# Patient Record
Sex: Male | Born: 1985 | Race: White | Hispanic: No | Marital: Married | Smoking: Never smoker
Health system: Southern US, Community
[De-identification: ages and names within clinical notes are randomized; demographics above are authoritative.]

## PROBLEM LIST (undated history)

## (undated) DIAGNOSIS — F1910 Other psychoactive substance abuse, uncomplicated: Secondary | ICD-10-CM

## (undated) DIAGNOSIS — F32A Depression, unspecified: Secondary | ICD-10-CM

## (undated) DIAGNOSIS — F329 Major depressive disorder, single episode, unspecified: Secondary | ICD-10-CM

---

## 2005-08-11 ENCOUNTER — Emergency Department (HOSPITAL_COMMUNITY): Admission: EM | Admit: 2005-08-11 | Discharge: 2005-08-12 | Payer: Self-pay | Admitting: Emergency Medicine

## 2012-01-13 DIAGNOSIS — F1910 Other psychoactive substance abuse, uncomplicated: Secondary | ICD-10-CM | POA: Insufficient documentation

## 2012-01-14 ENCOUNTER — Encounter (HOSPITAL_COMMUNITY): Payer: Self-pay | Admitting: *Deleted

## 2012-01-14 ENCOUNTER — Emergency Department (HOSPITAL_COMMUNITY)
Admission: EM | Admit: 2012-01-14 | Discharge: 2012-01-15 | Disposition: A | Discharge status: Rehabilitation Facility | Payer: BC Managed Care – PPO | Attending: Emergency Medicine | Admitting: Emergency Medicine

## 2012-01-14 DIAGNOSIS — F1910 Other psychoactive substance abuse, uncomplicated: Secondary | ICD-10-CM

## 2012-01-14 HISTORY — DX: Depression, unspecified: F32.A

## 2012-01-14 HISTORY — DX: Other psychoactive substance abuse, uncomplicated: F19.10

## 2012-01-14 HISTORY — DX: Major depressive disorder, single episode, unspecified: F32.9

## 2012-01-14 LAB — RAPID URINE DRUG SCREEN, HOSP PERFORMED
Cocaine: NOT DETECTED
Opiates: POSITIVE — AB

## 2012-01-14 LAB — CBC WITH DIFFERENTIAL/PLATELET
Basophils Absolute: 0 10*3/uL (ref 0.0–0.1)
Basophils Relative: 1 % (ref 0–1)
Eosinophils Absolute: 0.2 10*3/uL (ref 0.0–0.7)
Lymphs Abs: 1.8 10*3/uL (ref 0.7–4.0)
MCH: 29.2 pg (ref 26.0–34.0)
MCHC: 35 g/dL (ref 30.0–36.0)
Neutrophils Relative %: 51 % (ref 43–77)
Platelets: 258 10*3/uL (ref 150–400)
RBC: 4.55 MIL/uL (ref 4.22–5.81)
RDW: 12.3 % (ref 11.5–15.5)

## 2012-01-14 LAB — BASIC METABOLIC PANEL
Calcium: 9.1 mg/dL (ref 8.4–10.5)
Creatinine, Ser: 0.76 mg/dL (ref 0.50–1.35)
GFR calc non Af Amer: >90 mL/min (ref >90)
Glucose, Bld: 88 mg/dL (ref 70–99)
Sodium: 137 mEq/L (ref 135–145)

## 2012-01-14 MED ORDER — DICYCLOMINE HCL 20 MG PO TABS: 20.0000 mg | ORAL_TABLET | Freq: Four times a day (QID) | ORAL | Status: DC | PRN

## 2012-01-14 MED ORDER — METHOCARBAMOL 500 MG PO TABS
500.0000 mg | ORAL_TABLET | Freq: Three times a day (TID) | ORAL | Status: DC | PRN
  Filled 2012-01-14: qty 1

## 2012-01-14 MED ORDER — ONDANSETRON 4 MG PO TBDP: 4.0000 mg | ORAL_TABLET | Freq: Four times a day (QID) | ORAL | Status: DC | PRN

## 2012-01-14 MED ORDER — LOPERAMIDE HCL 2 MG PO CAPS: 2.0000 mg | ORAL_CAPSULE | ORAL | Status: DC | PRN

## 2012-01-14 MED ORDER — HYDROXYZINE HCL 25 MG PO TABS
25.0000 mg | ORAL_TABLET | Freq: Four times a day (QID) | ORAL | Status: DC | PRN
  Administered 2012-01-14: 25 mg via ORAL
  Filled 2012-01-14: qty 1

## 2012-01-14 MED ORDER — IBUPROFEN 200 MG PO TABS
400.0000 mg | ORAL_TABLET | Freq: Once | ORAL | Status: AC
  Administered 2012-01-14: 400 mg via ORAL
  Filled 2012-01-14: qty 2

## 2012-01-14 NOTE — ED Notes (Signed)
Pt alert and oriented x4. Respirations even and unlabored, bilateral symmetrical rise and fall of chest. Skin warm and dry. In no acute distress. Denies needs.   

## 2012-01-14 NOTE — ED Provider Notes (Signed)
History     CSN: [id number]  Arrival date & time 01/13/12  2300   First MD Initiated Contact with Patient 01/14/12 0310      Chief Complaint  Patient presents with  . Medical Clearance    Narcotic addiction    (Consider location/radiation/quality/duration/timing/severity/associated sxs/prior treatment) HPI Hx per PT, requesting detox from benzos and opiates, has been to rehab before for the same, last use 24 hours ago, no withdrawal symptoms at this time. No SI. No HI. No hallucinations. No n/v/d. No f/c. No ABD pain or cramping. Has been using cocaine and marijuana as well. Currently employed. Past Medical History  Diagnosis Date  . Polysubstance abuse     History reviewed. No pertinent past surgical history.  History reviewed. No pertinent family history.  History  Substance Use Topics  . Smoking status: Never Smoker   . Smokeless tobacco: Not on file  . Alcohol Use: Yes     rarely      Review of Systems  Constitutional: Negative for fever and chills.  HENT: Negative for neck pain and neck stiffness.   Eyes: Negative for pain.  Respiratory: Negative for shortness of breath.   Cardiovascular: Negative for chest pain.  Gastrointestinal: Negative for abdominal pain.  Genitourinary: Negative for dysuria.  Musculoskeletal: Negative for back pain.  Skin: Negative for rash.  Neurological: Negative for headaches.  All other systems reviewed and are negative.    Allergies  Alka-seltzer and Childrens acetaminophen  Home Medications  No current outpatient prescriptions on file.  BP 119/73  Pulse 65  Temp 97.9 F (36.6 C) (Oral)  Resp 18  SpO2 100%  Physical Exam  Constitutional: He is oriented to person, place, and time. He appears well-developed and well-nourished.  HENT:  Head: Normocephalic and atraumatic.  Eyes: Conjunctivae and EOM are normal. Pupils are equal, round, and reactive to light.  Neck: Trachea normal. Neck supple. No thyromegaly present.    Cardiovascular: Normal rate, regular rhythm, S1 normal, S2 normal and normal pulses.     No systolic murmur is present   No diastolic murmur is present  Pulses:      Radial pulses are 2+ on the right side, and 2+ on the left side.  Pulmonary/Chest: Effort normal and breath sounds normal. He has no wheezes. He has no rhonchi. He has no rales. He exhibits no tenderness.  Abdominal: Soft. Normal appearance and bowel sounds are normal. There is no tenderness. There is no CVA tenderness and negative Murphy's sign.  Musculoskeletal:       BLE:s Calves nontender, no cords or erythema, negative Homans sign  Neurological: He is alert and oriented to person, place, and time. He has normal strength. No cranial nerve deficit or sensory deficit. GCS eye subscore is 4. GCS verbal subscore is 5. GCS motor subscore is 6.  Skin: Skin is warm and dry. No rash noted. He is not diaphoretic.  Psychiatric: His speech is normal.       Cooperative and appropriate    ED Course  Procedures (including critical care time)  CBC reviewed WNL.  Psych labs sent.  D/W ACT - will evaluate.   Psych holding orders initiated. Clonidine protocol initiated.  Medically cleared.  MDM  Nursing notes reviewed. VS reviewed. Labs reviewed.         Sunnie Nielsen, MD 01/14/12 [phone]

## 2012-01-14 NOTE — ED Notes (Signed)
Orders for med clearance placed on downtime forms.

## 2012-01-14 NOTE — ED Notes (Addendum)
Called Faith-Marie RN and she stated that the family member and baby could go back to the room 8, but they were only able to stay for . Family member belongings were locked in locker 4. Family member was wanded by security.

## 2012-01-14 NOTE — ED Notes (Signed)
Report given to nicole, rn

## 2012-01-14 NOTE — ED Notes (Signed)
Pt c/o narcotic addiction x 12 years. Pt has hx of polysubstance abuse. Pt states last use klonopin 1 mg x 5 tabs, Vicodin x 5 tabs, Xanax 1 mg x 3 tabs prior to arrival. Pt denies SI/HI/hallucinations and delusions.

## 2012-01-14 NOTE — ED Notes (Signed)
md alerted of pts pain. md will order medication

## 2012-01-14 NOTE — ED Notes (Signed)
rn talked to act team and conveyed to family and pt that if pt wants inpatient detox, he must be seen by act team and be referred. Pt verbalizes understanding this.

## 2012-01-15 ENCOUNTER — Encounter (HOSPITAL_COMMUNITY): Payer: Self-pay | Admitting: *Deleted

## 2012-01-15 NOTE — BH Assessment (Signed)
Assessment Note   Isaiah Arnold is a 26 y.o. male who presents to [facility] with request for detox, denies SI/HI/Psych.  Pt currently abusing Oxycontin 50-100mg s daily; Vicodin 25-50mg s daily; Adderall 20mg s daily; THC 2-3 grams daily; Klonopin 1-5mg s daily; Xanax 1-5mg s daily; Valium 1-5 mgs daily; Hydrocodine 10-15-5mg s daily.  Pt reports last use was 01/14/12.  Pt told this writer that he also occasionally uses Cocaine, approx 1 gram, last use was approx 2-3 wks ago.  Pt admits to using Suboxone dissolvable film papers every other week when he can't get drugs.  Pt says last used Suboxone 5 days ago and uses 2-8mg s when uses Suboxone.  Pt has been using since he was 27 yrs old and has never engaged in any detox program.  Pt c/o w/d sxs: shakes, anxiety, sweats, chills, body aches, stomach cramps, restless legs.  Pt told this Clinical research associate that he wants to enter rehab was told he had to have detox prior to entering in any rehab program.  Pt says he wants to detox now because his girlfriend has threatened to take there son and leave if he didn't seek help.    Axis I: Polysub Dep  Axis II: Deferred Axis III:  Past Medical History  Diagnosis Date  . Polysubstance abuse   . Depression    Axis IV: other psychosocial or environmental problems, problems related to social environment and problems with primary support group Axis V: 51-60 moderate symptoms  Past Medical History:  Past Medical History  Diagnosis Date  . Polysubstance abuse   . Depression     History reviewed. No pertinent past surgical history.  Family History: History reviewed. No pertinent family history.  Social History:  reports that he has never smoked. He does not have any smokeless tobacco history on file. He reports that he uses illicit drugs (Marijuana, Oxycodone, Opium, and Benzodiazepines). He reports that he does not drink alcohol.  Additional Social History:  Alcohol / Drug Use Pain Medications: Oxycontin, Vicodin, Hydrocodine    Prescriptions: None  Over the Counter: None  Longest period of sobriety (when/how long): None  Negative Consequences of Use: Personal relationships Withdrawal Symptoms: Sweats;Tremors;Fever / Chills;Agitation;Other (Comment) (W/D Sxs: anxiety, body aches/cramps, )  CIWA: CIWA-Ar BP: 100/63 mmHg Pulse Rate: 69  Nausea and Vomiting: no nausea and no vomiting Tactile Disturbances: none Tremor: no tremor Auditory Disturbances: not present Paroxysmal Sweats: no sweat visible Visual Disturbances: not present Anxiety: no anxiety, at ease Headache, Fullness in Head: none present Agitation: normal activity Orientation and Clouding of Sensorium: oriented and can do serial additions CIWA-Ar Total: 0  COWS: Clinical Opiate Withdrawal Scale (COWS) Resting Pulse Rate: Pulse Rate 80 or below Sweating: Subjective report of chills or flushing Restlessness: Reports difficulty sitting still, but is able to do so Pupil Size: Pupils pinned or normal size for room light Bone or Joint Aches: Patient reports sever diffuse aching of joints/muscles Runny Nose or Tearing: Not present GI Upset: No GI symptoms Tremor: Slight tremor observable Yawning: No yawning Anxiety or Irritability: Patient obviously irritable/anxious Gooseflesh Skin: Skin is smooth COWS Total Score: 8   Allergies:  Allergies  Allergen Reactions  . Alka-Seltzer (Aspirin Effervescent) Nausea And Vomiting  . Childrens Acetaminophen (Acetaminophen) Nausea And Vomiting    Home Medications:  (Not in a hospital admission)  OB/GYN Status:  No LMP for male patient.  General Assessment Data Location of Assessment: [facility] ED Living Arrangements: Alone Can pt return to current living arrangement?: Yes Admission Status: Voluntary Is patient  capable of signing voluntary admission?: Yes Transfer from: Acute Hospital Referral Source: MD  Education Status Is patient currently in school?: No Current Grade: None  Highest grade of school  patient has completed: None  Name of school: None  Contact person: None   Risk to self Suicidal Ideation: No Suicidal Intent: No Is patient at risk for suicide?: No Suicidal Plan?: No Access to Means: No What has been your use of drugs/alcohol within the last 12 months?: Abusing: Vicodin, Adderall, THC, Klonopin, Xanax, Valium, Hydrocodine  Previous Attempts/Gestures: No How many times?: 0  Other Self Harm Risks: None  Triggers for Past Attempts: None known Intentional Self Injurious Behavior: None Family Suicide History: Yes (Relative committed SI ) Recent stressful life event(s): Other (Comment);Conflict (Comment) (Chronic SA, Relational with girlfriend ) Persecutory voices/beliefs?: No Depression: Yes Depression Symptoms: Loss of interest in usual pleasures Substance abuse history and/or treatment for substance abuse?: Yes Suicide prevention information given to non-admitted patients: Not applicable  Risk to Others Homicidal Ideation: No Thoughts of Harm to Others: No Current Homicidal Intent: No Current Homicidal Plan: No Access to Homicidal Means: No Identified Victim: None  History of harm to others?: No Assessment of Violence: None Noted Violent Behavior Description: None Noted  Does patient have access to weapons?: No Criminal Charges Pending?: No Does patient have a court date: No  Psychosis Hallucinations: None noted Delusions: None noted  Mental Status Report Appear/Hygiene: Disheveled Eye Contact: Good Motor Activity: Unremarkable Speech: Logical/coherent Level of Consciousness: Alert Mood: Anxious;Irritable;Depressed;Ashamed/humiliated Affect: Anxious;Irritable;Depressed;Appropriate to circumstance Anxiety Level: Moderate Thought Processes: Coherent;Relevant Judgement: Unimpaired Orientation: Person;Place;Time;Situation Obsessive Compulsive Thoughts/Behaviors: None  Cognitive Functioning Concentration: Normal Memory: Recent Intact;Remote  Intact IQ: Average Insight: Fair Impulse Control: Fair Appetite: Good Weight Loss: 0  Weight Gain: 0  Sleep: No Change Total Hours of Sleep: 8  Vegetative Symptoms: None  ADLScreening [facility] Assessment Services) Patient's cognitive ability adequate to safely complete daily activities?: Yes Patient able to express need for assistance with ADLs?: Yes Independently performs ADLs?: Yes  Abuse/Neglect [facility]) Physical Abuse: Denies Verbal Abuse: Denies Sexual Abuse: Denies  Prior Inpatient Therapy Prior Inpatient Therapy: No Prior Therapy Dates: None  Prior Therapy Facilty/Provider(s): None  Reason for Treatment: None   Prior Outpatient Therapy Prior Outpatient Therapy: No Prior Therapy Dates: None  Prior Therapy Facilty/Provider(s): None  Reason for Treatment: None   ADL Screening (condition at time of admission) Patient's cognitive ability adequate to safely complete daily activities?: Yes Patient able to express need for assistance with ADLs?: Yes Independently performs ADLs?: Yes Weakness of Legs: None Weakness of Arms/Hands: None  Home Assistive Devices/Equipment Home Assistive Devices/Equipment: Eyeglasses  Therapy Consults (therapy consults require a physician order) PT Evaluation Needed: No OT Evalulation Needed: No SLP Evaluation Needed: No Abuse/Neglect Assessment (Assessment to be complete while patient is alone) Physical Abuse: Denies Verbal Abuse: Denies Sexual Abuse: Denies Exploitation of patient/patient's resources: Denies Self-Neglect: Denies Values / Beliefs Cultural Requests During Hospitalization: None Spiritual Requests During Hospitalization: None Consults Spiritual Care Consult Needed: No Social Work Consult Needed: No Merchant navy officer (For Healthcare) Advance Directive: Patient does not have advance directive;Patient would not like information Pre-existing out of facility DNR order (yellow form or pink MOST form): No Nutrition  Screen Diet: Regular Unintentional weight loss greater than 10lbs within the last month: No Problems chewing or swallowing foods and/or liquids: No Home Tube Feeding or Total Parenteral Nutrition (TPN): No Patient appears severely malnourished: No  Additional Information 1:1 In Past 12 Months?: No CIRT  Risk: No Elopement Risk: No Does patient have medical clearance?: Yes     Disposition:  Disposition Disposition of Patient: Inpatient treatment program;Referred to ([facility] ) Type of inpatient treatment program: Adult Patient referred to: [facility]  On Site Evaluation by:   Reviewed with Physician:     Murrell Redden 01/15/2012 1:21 AM

## 2012-01-15 NOTE — ED Notes (Signed)
[facility] driver has arrived to take pt to facility. MD notified of pt acceptance to [facility] and need for disposition and discharge.

## 2012-01-15 NOTE — ED Notes (Signed)
All belongings sent with patient to [facility]

## 2012-01-24 ENCOUNTER — Ambulatory Visit
Admission: RE | Admit: 2012-01-24 | Discharge: 2012-01-24 | Disposition: A | Discharge status: Home / Self Care | Payer: BC Managed Care – PPO | Source: Ambulatory Visit | Attending: Student | Admitting: Student

## 2012-01-24 ENCOUNTER — Other Ambulatory Visit: Payer: Self-pay | Admitting: Student

## 2012-01-24 DIAGNOSIS — R05 Cough: Secondary | ICD-10-CM

## 2013-12-11 IMAGING — CR DG CHEST 2V
3 series · 3 of 3 positions shown · non-contrast
Comparison: None.

CLINICAL DATA: Left-sided chest pain.  Nonsmoker.

CHEST - 2 VIEW

[view not recorded (1 of 3)]
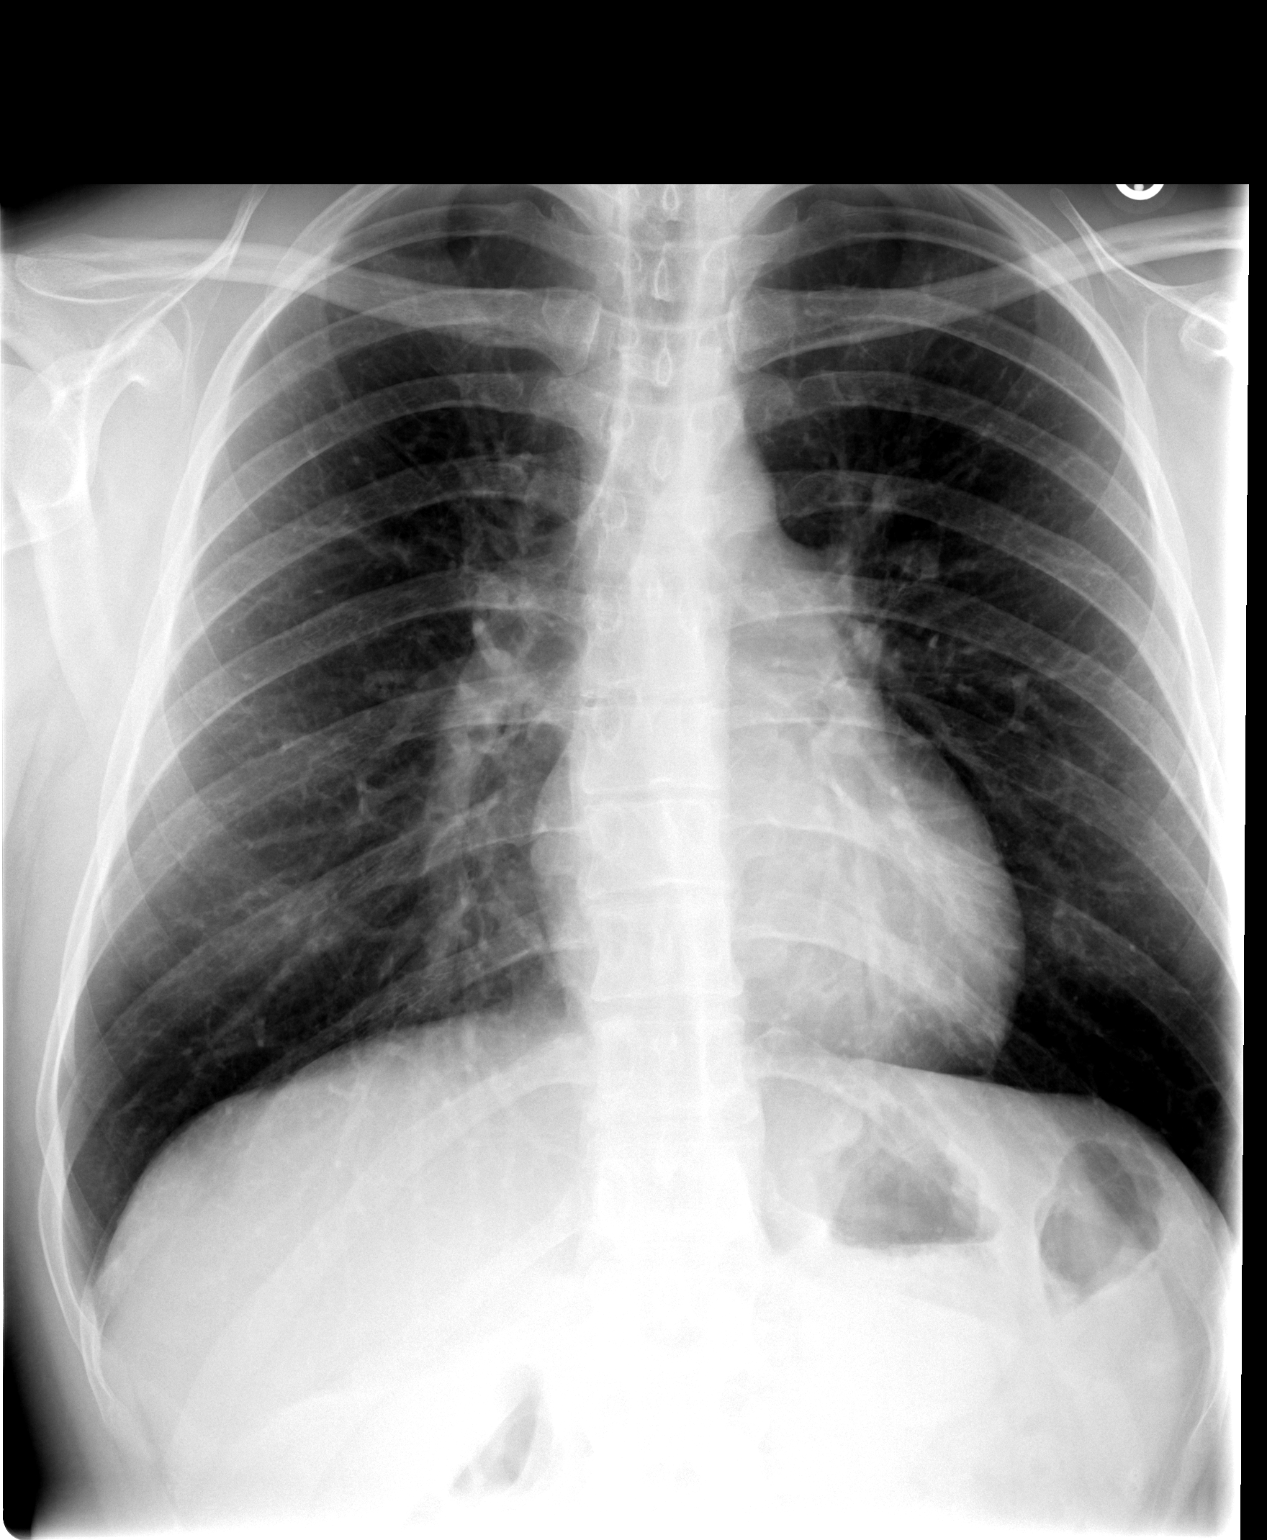

[view not recorded (2 of 3)]
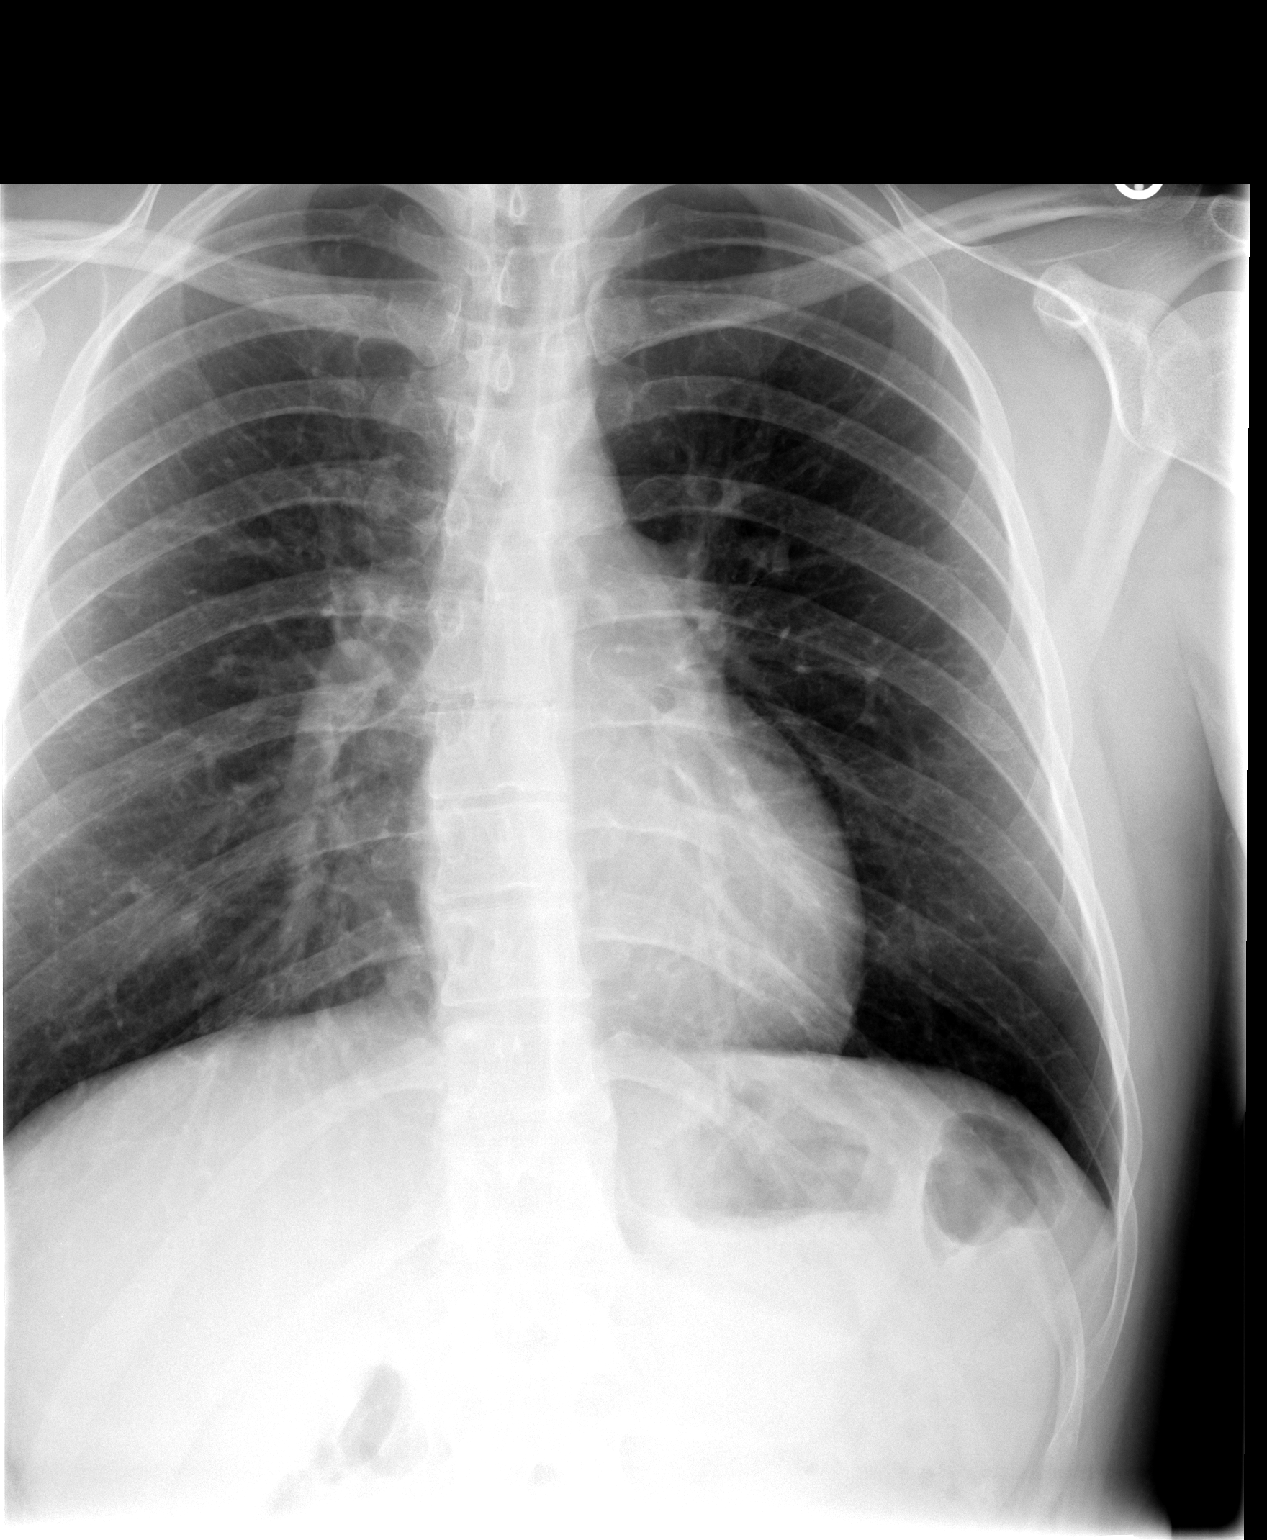

[view not recorded (3 of 3)]
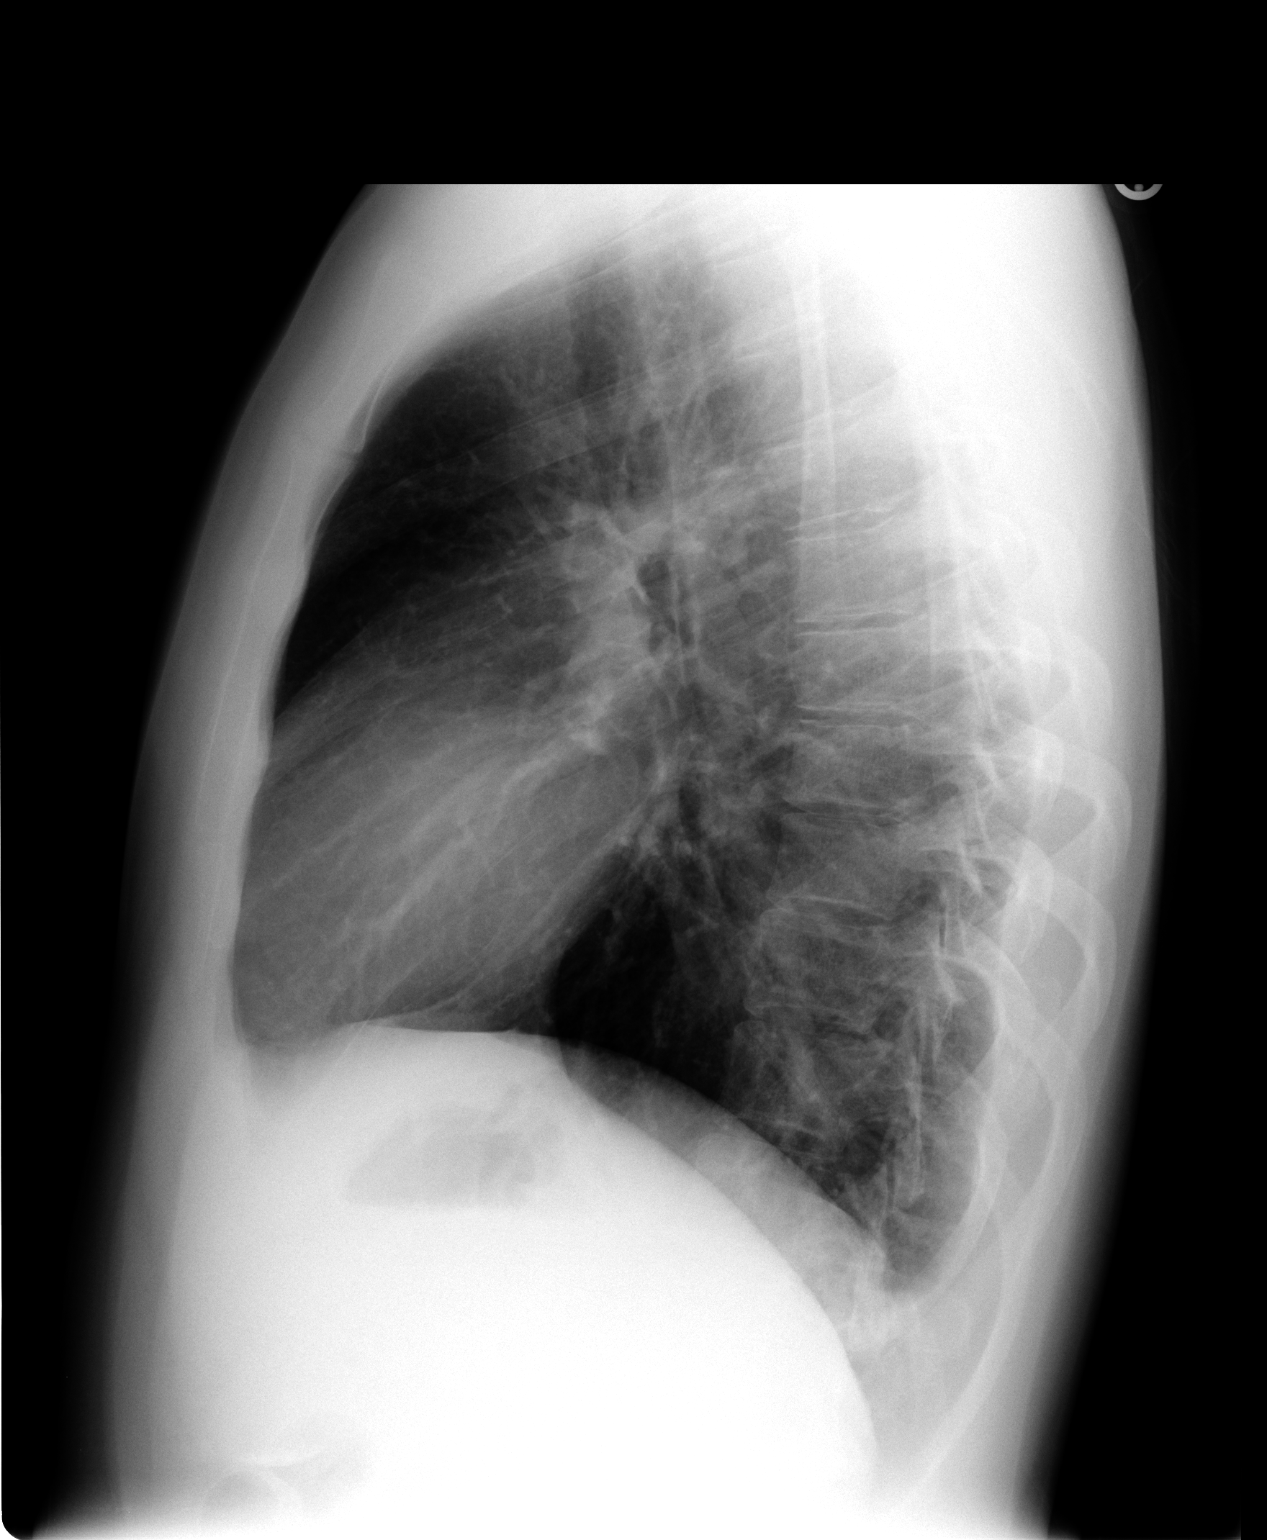

[3 of 3 positions shown; findings below may reference images not displayed]

FINDINGS: Midline trachea.  Normal heart size and mediastinal
contours. No pleural effusion or pneumothorax.  Clear lungs.
IMPRESSION: Normal chest.  No acute process or explanation for the left-sided
chest pain.

## 2019-04-01 ENCOUNTER — Other Ambulatory Visit: Payer: Self-pay

## 2019-04-01 DIAGNOSIS — Z20822 Contact with and (suspected) exposure to covid-19: Secondary | ICD-10-CM

## 2019-04-02 LAB — NOVEL CORONAVIRUS, NAA: SARS-CoV-2, NAA: NOT DETECTED

## 2019-06-17 ENCOUNTER — Ambulatory Visit: Payer: Managed Care, Other (non HMO) | Attending: Internal Medicine

## 2019-06-17 DIAGNOSIS — R238 Other skin changes: Secondary | ICD-10-CM

## 2019-06-17 DIAGNOSIS — U071 COVID-19: Secondary | ICD-10-CM

## 2019-06-18 LAB — NOVEL CORONAVIRUS, NAA: SARS-CoV-2, NAA: NOT DETECTED

## 2019-08-28 ENCOUNTER — Ambulatory Visit: Payer: Managed Care, Other (non HMO) | Attending: Internal Medicine

## 2019-08-28 DIAGNOSIS — Z20822 Contact with and (suspected) exposure to covid-19: Secondary | ICD-10-CM

## 2019-08-29 LAB — NOVEL CORONAVIRUS, NAA: SARS-CoV-2, NAA: NOT DETECTED

## 2020-02-24 NOTE — Progress Notes (Signed)
CARDIOLOGY CONSULT NOTE       Patient ID: Isaiah Arnold MRN: [id number] DOB/AGE: 1985/08/06 34 y.o.  Admit date: (Not on file) Referring Physician: Rosann Auerbach PT/OT  Primary Physician: No primary care provider on file. Primary Cardiologist: New Reason for Consultation: Chest Pain / Abnormal ECG   Active Problems:   * No active hospital problems. *   HPI:  34 y.o. history of depression and polysubstance abuse Has used oxycontin, Vicodin, Adderall, THC, Klonopin, Xanax, Valium and Hydrocodine in past.  Has had inpatient admissions to [facility]. Doing better Does smoke marijuana daily and uses Adderall to stay awake. 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 2 weeks ago had atypical left sided chest pain lasting hours worse at rest and night when trying to sleep. Indicated working out harder some but didn't feel like muscle pull. No fevers , pleurisy LE edema, calf pain. He has no old ECG ECG in clinic today with RBBB.   ROS All other systems reviewed and negative except as noted above  Past Medical History:  Diagnosis Date  . Depression   . Polysubstance abuse     No family history on file.  Social History   Socioeconomic History  . Marital status: Single    Spouse name: Not on file  . Number of children: Not on file  . Years of education: Not on file  . Highest education level: Not on file  Occupational History  . Not on file  Tobacco Use  . Smoking status: Never Smoker  Substance and Sexual Activity  . Alcohol use: No    Comment: rarely  . Drug use: Yes    Types: Marijuana, Oxycodone, Opium, Benzodiazepines    Comment: narcotics, benzos  . Sexual activity: Yes    Birth control/protection: None  Other Topics Concern  . Not on file  Social History Narrative  . Not on file   Social Determinants of Health   Financial Resource Strain:   . Difficulty of Paying Living Expenses: Not on file  Food Insecurity:   . Worried About Programme researcher, broadcasting/film/video in  the Last Year: Not on file  . Ran Out of Food in the Last Year: Not on file  Transportation Needs:   . Lack of Transportation (Medical): Not on file  . Lack of Transportation (Non-Medical): Not on file  Physical Activity:   . Days of Exercise per Week: Not on file  . Minutes of Exercise per Session: Not on file  Stress:   . Feeling of Stress : Not on file  Social Connections:   . Frequency of Communication with Friends and Family: Not on file  . Frequency of Social Gatherings with Friends and Family: Not on file  . Attends Religious Services: Not on file  . Active Member of Clubs or Organizations: Not on file  . Attends Banker Meetings: Not on file  . Marital Status: Not on file  Intimate Partner Violence:   . Fear of Current or Ex-Partner: Not on file  . Emotionally Abused: Not on file  . Physically Abused: Not on file  . Sexually Abused: Not on file    No past surgical history on file.   No current outpatient medications on file.    Physical Exam: There were no vitals taken for this visit.    Affect appropriate Healthy:  appears stated age HEENT: normal Neck supple with no adenopathy JVP normal no bruits no thyromegaly Lungs clear  with no wheezing and good diaphragmatic motion Heart:  S1/S2 no murmur, no rub, gallop or click PMI normal Abdomen: benighn, BS positve, no tenderness, no AAA no bruit.  No HSM or HJR Distal pulses intact with no bruits No edema Neuro non-focal Skin warm and dry No muscular weakness  Labs:   Lab Results  Component Value Date   WBC 5.2 01/13/2012   HGB 13.3 01/13/2012   HCT 38.0 (L) 01/13/2012   MCV 83.5 01/13/2012   PLT 258 01/13/2012   No results for input(s): NA, K, CL, CO2, BUN, CREATININE, CALCIUM, PROT, BILITOT, ALKPHOS, ALT, AST, GLUCOSE in the last 168 hours.  Invalid input(s): LABALBU No results found for: CKTOTAL, CKMB, CKMBINDEX, TROPONINI No results found for: CHOL No results found for: HDL No results  found for: LDLCALC No results found for: TRIG No results found for: CHOLHDL No results found for: LDLDIRECT    Radiology: No results found.  EKG: SR RBBB rate 74    ASSESSMENT AND PLAN:   1. Chest Pain: atypical with abnormal ECG RBBB f/u exercise echo to r/o structural heart disease and assess hemodynamic response to exercise 2. Polysubstance Abuse. Discussed strategies to avoid relapse, Discussed his adderall use in particular regarding stimulants  3. RBBB:  No old ecg may be normal variant for him see above regarding echo Yearly ECG   F/U PRN if stress echo normal   Signed: Charlton Haws 02/24/2020, 5:41 PM

## 2020-03-02 ENCOUNTER — Other Ambulatory Visit: Payer: Self-pay

## 2020-03-02 ENCOUNTER — Encounter: Payer: Self-pay | Admitting: Cardiovascular Disease

## 2020-03-02 ENCOUNTER — Ambulatory Visit: Payer: Managed Care, Other (non HMO) | Admitting: Cardiovascular Disease

## 2020-03-02 VITALS — BP 124/82 | HR 74 | Ht 69.0 in | Wt 163.6 lb

## 2020-03-02 DIAGNOSIS — R9431 Abnormal electrocardiogram [ECG] [EKG]: Secondary | ICD-10-CM

## 2020-03-02 DIAGNOSIS — R079 Chest pain, unspecified: Secondary | ICD-10-CM

## 2020-03-02 NOTE — Patient Instructions (Addendum)
Medication Instructions:  *If you need a refill on your cardiac medications before your next appointment, please call your pharmacy*  Lab Work: If you have labs (blood work) drawn today and your tests are completely normal, you will receive your results only by: Marland Kitchen MyChart Message (if you have MyChart) OR . A paper copy in the mail If you have any lab test that is abnormal or we need to change your treatment, we will call you to review the results.  Testing/Procedures: Your physician has requested that you have a stress echocardiogram. For further information please visit [email or web address] Please follow instruction sheet as given.  Follow-Up: At [facility], you and your health needs are our priority.  As part of our continuing mission to provide you with exceptional heart care, we have created designated Provider Care Teams.  These Care Teams include your primary Cardiologist (physician) and Advanced Practice Providers (APPs -  Physician Assistants and Nurse Practitioners) who all work together to provide you with the care you need, when you need it.  We recommend signing up for the patient portal called "MyChart".  Sign up information is provided on this After Visit Summary.  MyChart is used to connect with patients for Virtual Visits (Telemedicine).  Patients are able to view lab/test results, encounter notes, upcoming appointments, etc.  Non-urgent messages can be sent to your provider as well.   To learn more about what you can do with MyChart, go to [email or web address].    Your next appointment:   As needed  The format for your next appointment:   In Person  Provider:   You may see Dr. Eden Emms or one of the following Advanced Practice Providers on your designated Care Team:    Norma Fredrickson, NP  Nada Boozer, NP  Georgie Chard, NP

## 2020-03-14 ENCOUNTER — Other Ambulatory Visit (HOSPITAL_COMMUNITY)
Admission: RE | Admit: 2020-03-14 | Discharge: 2020-03-14 | Disposition: A | Discharge status: Home / Self Care | Payer: Managed Care, Other (non HMO) | Source: Ambulatory Visit | Attending: Cardiovascular Disease | Admitting: Cardiovascular Disease

## 2020-03-14 ENCOUNTER — Telehealth (HOSPITAL_COMMUNITY): Payer: Self-pay | Admitting: Cardiovascular Disease

## 2020-03-14 ENCOUNTER — Telehealth: Payer: Self-pay | Admitting: Cardiovascular Disease

## 2020-03-14 DIAGNOSIS — Z01812 Encounter for preprocedural laboratory examination: Secondary | ICD-10-CM | POA: Insufficient documentation

## 2020-03-14 DIAGNOSIS — Z20822 Contact with and (suspected) exposure to covid-19: Secondary | ICD-10-CM | POA: Diagnosis not present

## 2020-03-14 LAB — SARS CORONAVIRUS 2 (TAT 6-24 HRS): SARS Coronavirus 2: NEGATIVE

## 2020-03-14 NOTE — Telephone Encounter (Signed)
Patient states he was not told he would have to quarantine for 3 days after his covid test and he has work. He would like to know if the quarantine is required for the stress echo.

## 2020-03-14 NOTE — Telephone Encounter (Signed)
Patient called and cancelled Stress echo due to he [place] unable to quarantine and was not told he had to when he scheduled appointment per scheduler. Appt was made by scheduler at check out.. Order will be removed and if patient calls back to schedule we will reinstate the order.

## 2020-03-14 NOTE — Telephone Encounter (Signed)
Returned call to pt and made him aware that yes he is required to quarantine after his covid test was complete until his Stress Echo is done. Pt was very upset over the phone because he states that no one told him that at the appointment and this is important information for him to know. Pt requested to cancel his testing. Appt has been cancelled and pt declined to reschedule  Will forward to Jaynee Eagles, Rn to make her aware.

## 2020-03-17 ENCOUNTER — Other Ambulatory Visit (HOSPITAL_COMMUNITY): Payer: Managed Care, Other (non HMO)

## 2021-02-20 ENCOUNTER — Ambulatory Visit (INDEPENDENT_AMBULATORY_CARE_PROVIDER_SITE_OTHER): Payer: Managed Care, Other (non HMO) | Admitting: Otolaryngology

## 2021-02-20 ENCOUNTER — Other Ambulatory Visit: Payer: Self-pay

## 2021-02-20 DIAGNOSIS — H6983 Other specified disorders of Eustachian tube, bilateral: Secondary | ICD-10-CM | POA: Diagnosis not present

## 2021-02-20 DIAGNOSIS — H6506 Acute serous otitis media, recurrent, bilateral: Secondary | ICD-10-CM | POA: Diagnosis not present

## 2021-02-20 MED ORDER — TRIAMCINOLONE ACETONIDE 55 MCG/ACT NA AERO: 2.0000 | INHALATION_SPRAY | Freq: Every day | NASAL | 12 refills | Status: AC

## 2021-02-20 NOTE — Progress Notes (Signed)
HPI: Isaiah Arnold is a 35 y.o. male who returns today for evaluation of ear complaints.  He had previously been seen a little over 3 years ago with similar complaints.  He had done well up until about 2 or 3 weeks ago when he had a lot of congestion in his nose and sinus problems.  He subsequently developed a left earache with pressure and decreased hearing in both ears.  He was treated with antibiotics as well as Afrin but still has congestion and fullness in his ears and has difficulty "popping" his ears.  He also complains of decreased hearing..  Past Medical History:  Diagnosis Date   Depression    Polysubstance abuse (HCC)    No past surgical history on file. Social History   Socioeconomic History   Marital status: Single    Spouse name: Not on file   Number of children: Not on file   Years of education: Not on file   Highest education level: Not on file  Occupational History   Not on file  Tobacco Use   Smoking status: Never   Smokeless tobacco: Never  Substance and Sexual Activity   Alcohol use: No    Comment: rarely   Drug use: Yes    Types: Marijuana, Oxycodone, Opium, Benzodiazepines    Comment: narcotics, benzos   Sexual activity: Yes    Birth control/protection: None  Other Topics Concern   Not on file  Social History Narrative   Not on file   Social Determinants of Health   Financial Resource Strain: Not on file  Food Insecurity: Not on file  Transportation Needs: Not on file  Physical Activity: Not on file  Stress: Not on file  Social Connections: Not on file   Family History  Problem Relation Age of Onset   Heart disease Father    Allergies  Allergen Reactions   Alka-Seltzer [Aspirin Effervescent] Nausea And Vomiting   Childrens Acetaminophen [Acetaminophen] Nausea And Vomiting   Prior to Admission medications   Not on File     Positive ROS: Otherwise negative  All other systems have been reviewed and were otherwise negative with the  exception of those mentioned in the HPI and as above.  Physical Exam: Constitutional: Alert, well-appearing, no acute distress Ears: External ears without lesions or tenderness. Ear canals are clear bilaterally.  Both TMs are retracted with middle ear effusion that is minimal and serous.  I was able to insufflate some air behind both TMs in the office.  On hearing screening with a 512 1024 tuning fork Weber was midline.  AC was equivocal to [facility] on the right side and AC was greater than BC on the left side. Nasal: External nose without lesions. Septum with mild deformity and moderate rhinitis.  After decongesting the nose both millimeters regions were clear with no evidence of active infection or mucopurulent discharge.. Clear nasal passages Oral: Lips and gums without lesions. Tongue and palate mucosa without lesions. Posterior oropharynx clear. Neck: No palpable adenopathy or masses Respiratory: Breathing comfortably  Skin: No facial/neck lesions or rash noted.  Procedures  Assessment: Chronic rhinitis with history of chronic eustachian tube dysfunction.  Plan: Recommended regular use of nasal steroid spray and will call in Nasacort 2 sprays each nostril daily at night into his pharmacy. Also gave him information on 2 devices at may help him "pop his ears". [place]achi in the ear popper. He will follow-up as needed.   Narda Bonds, MD

## 2022-01-31 ENCOUNTER — Telehealth (HOSPITAL_COMMUNITY): Payer: Self-pay | Admitting: Licensed Clinical Social Worker

## 2022-01-31 NOTE — Telephone Encounter (Signed)
The therapist receives a voicemail from [place] saying that he is leaving [facility] early and wants to possibly attend this SA IOP.  The therapist gives him a brief overview of the program and [place] schedules to come for a CCA with this therapist on 02/05/22 at 1 p.m.  Myrna Blazer, MA, LCSW, [facility], LCAS 01/31/2022

## 2022-02-01 NOTE — Telephone Encounter (Signed)
Sair left a voicemail for this therapist to call him back today.  Myrna Blazer, MA, LCSW, [facility], LCAS 01/31/2022

## 2022-02-05 ENCOUNTER — Encounter (HOSPITAL_COMMUNITY): Payer: Self-pay

## 2022-02-05 ENCOUNTER — Ambulatory Visit (INDEPENDENT_AMBULATORY_CARE_PROVIDER_SITE_OTHER): Payer: 59 | Admitting: Licensed Clinical Social Worker

## 2022-02-05 DIAGNOSIS — F1420 Cocaine dependence, uncomplicated: Secondary | ICD-10-CM

## 2022-02-05 DIAGNOSIS — F1220 Cannabis dependence, uncomplicated: Secondary | ICD-10-CM

## 2022-02-05 DIAGNOSIS — F1590 Other stimulant use, unspecified, uncomplicated: Secondary | ICD-10-CM

## 2022-02-05 DIAGNOSIS — F1120 Opioid dependence, uncomplicated: Secondary | ICD-10-CM

## 2022-02-05 DIAGNOSIS — F1320 Sedative, hypnotic or anxiolytic dependence, uncomplicated: Secondary | ICD-10-CM
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
## 2022-02-05 NOTE — Progress Notes (Signed)
See CCA 

## 2022-02-05 NOTE — Plan of Care (Signed)
Isaiah Arnold verbally agrees to the following Care Plan: Problem:  Substance Use Goal:  Isaiah Arnold will abstain from using drugs 7/7 days per week based on self-report and weekly UDS. Outcome: Not Applicable Goal: Isaiah Arnold will report a reduction in his feelings of depression as evidenced by his PHQ-9 dropping from an 18 to a 4 or less.  Outcome: Not Applicable

## 2022-02-07 ENCOUNTER — Other Ambulatory Visit (HOSPITAL_COMMUNITY): Payer: 59 | Attending: Medical | Admitting: Licensed Clinical Social Worker

## 2022-02-07 VITALS — BP 130/82 | HR 76 | Ht 69.0 in | Wt 155.0 lb

## 2022-02-07 DIAGNOSIS — F1420 Cocaine dependence, uncomplicated: Secondary | ICD-10-CM | POA: Diagnosis not present

## 2022-02-07 DIAGNOSIS — F1320 Sedative, hypnotic or anxiolytic dependence, uncomplicated: Secondary | ICD-10-CM | POA: Insufficient documentation

## 2022-02-07 DIAGNOSIS — F1011 Alcohol abuse, in remission: Secondary | ICD-10-CM | POA: Diagnosis not present

## 2022-02-07 DIAGNOSIS — F1924 Other psychoactive substance dependence with psychoactive substance-induced mood disorder: Secondary | ICD-10-CM | POA: Diagnosis not present

## 2022-02-07 DIAGNOSIS — Z6372 Alcoholism and drug addiction in family: Secondary | ICD-10-CM

## 2022-02-07 DIAGNOSIS — F4312 Post-traumatic stress disorder, chronic: Secondary | ICD-10-CM | POA: Diagnosis present

## 2022-02-07 DIAGNOSIS — F1590 Other stimulant use, unspecified, uncomplicated: Secondary | ICD-10-CM | POA: Diagnosis not present

## 2022-02-07 DIAGNOSIS — Z79899 Other long term (current) drug therapy: Secondary | ICD-10-CM | POA: Insufficient documentation

## 2022-02-07 DIAGNOSIS — F1220 Cannabis dependence, uncomplicated: Secondary | ICD-10-CM | POA: Insufficient documentation

## 2022-02-07 DIAGNOSIS — F1120 Opioid dependence, uncomplicated: Secondary | ICD-10-CM | POA: Insufficient documentation

## 2022-02-07 DIAGNOSIS — F439 Reaction to severe stress, unspecified: Secondary | ICD-10-CM | POA: Diagnosis not present

## 2022-02-07 DIAGNOSIS — F1920 Other psychoactive substance dependence, uncomplicated: Secondary | ICD-10-CM

## 2022-02-07 DIAGNOSIS — T7432XS Child psychological abuse, confirmed, sequela: Secondary | ICD-10-CM

## 2022-02-07 MED ORDER — BACLOFEN 10 MG PO TABS: 10.0000 mg | ORAL_TABLET | Freq: Three times a day (TID) | ORAL | 1 refills | Status: DC
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
## 2022-02-07 NOTE — Progress Notes (Signed)
Daily Group Progress Note   Program: CD IOP     Group Time: 9 a.m. to 12 p.m.   Type of Therapy: Process and Psychoeducational    Topic: The therapist checks in with group members, assesses for SI/HI/psychosis and overall level of functioning. The therapist inquires about sobriety date and number of community support meetings attended since last session.    The therapist has group members do introductions as there is a new member in group and another fairly new group member who has not sat through group introductions before in which members explain how they came to be in [facility].   The therapist educates group members on the dangers associating with withdrawing from alcohol and benzodiazepines. He talks about tobacco as being the gateway drug and encourages members who smoke to consider smoking cessation sharing data on how quitting smoking increases the long-term success of quitting other substances.   The therapist facilitates a discussion on the reasons that people continue to relapse in spite of past consequences talking about the role of Dopamine; the fact that people can develop amnesia for past consequences once they are far enough in the past; issues that people with addiction have with trusting people; how substance use is the only way some people know how to connect socially; and people's rebalancing the equation considering the euphoria that they get from getting drunk or high worth the negative outcomes.    Summary: Winner presents for his initial group rating his depression as a "0" and anxiety as a "2" with no SI or HI.   He says that he has attended some meetings since he met with this therapist.  Kalven talks about the fact that he is struggling with feeling foggy and that he is incapable of enjoying anything. He admits that his disease continues to tell him that once he gets the drugs out of his system that he can perhaps just smoke pot. When the therapist asks what his health self  is saying, Trek says that he knows based on past experience that any limited drug use will lead back to full-blown drug use.   He cannot conceive of being able to enjoy life without drugs and says that he started using at around 13 so has no idea concerning what other hobbies or activities that he may enjoy.  Sava is encouraged to not think of never using again but to just focus on a day at a time and is told that his withdrawal symptoms with improve with time with the therapist reminding him that he left treatment before being recommended to do so.    Progress Towards Goals: Ahaan denies any drug use.    UDS collected: Yes Results: No   AA/NA attended?: Yes   Sponsor?: No   Trampus Phenix, [place], LCSW, [facility], [place] 02/07/2022

## 2022-02-09 ENCOUNTER — Other Ambulatory Visit (HOSPITAL_COMMUNITY): Payer: 59 | Attending: Medical | Admitting: Licensed Clinical Social Worker

## 2022-02-09 DIAGNOSIS — R45851 Suicidal ideations: Secondary | ICD-10-CM | POA: Diagnosis present

## 2022-02-09 DIAGNOSIS — F1420 Cocaine dependence, uncomplicated: Secondary | ICD-10-CM

## 2022-02-09 DIAGNOSIS — F1320 Sedative, hypnotic or anxiolytic dependence, uncomplicated: Secondary | ICD-10-CM

## 2022-02-09 DIAGNOSIS — F1220 Cannabis dependence, uncomplicated: Secondary | ICD-10-CM

## 2022-02-09 DIAGNOSIS — F1590 Other stimulant use, unspecified, uncomplicated: Secondary | ICD-10-CM

## 2022-02-09 NOTE — Progress Notes (Signed)
Daily Group Progress Note   Program: CD IOP     Group Time: 9 a.m. to 12 p.m.   Type of Therapy: Process and Psychoeducational    Topic: The therapist checks in with group members, assesses for SI/HI/psychosis and overall level of functioning. The therapist inquires about sobriety date and number of community support meetings attended since last session.    The therapist addresses group members' hesitancy to want to attend meetings talking about the AA saying, "fake it until you make it." He also facilitate a discussion concerning people feeling like they do not fit in AA or NA as the other members are either considered worse off than they are or the opposite. The therapist uses the Jellenick Curve to illustrate that people with addiction have the same disease but can be in different states of progression that other people are.  The therapist shows the video, "Breaking The Addiction Cycle" and solicits group members' reaction to the information presented in this video while confusing on the need to challenge and change one's beliefs and to avoid engaging in using rituals.    Summary: Isaiah Arnold presents for his group rating his depression as a "0" and anxiety as a "0" with no SI or HI.   He says that he has attended about twelve meetings total but has not shared and says that he feels almost guilty about attending when he hears stories people share in which they have experienced consequences such as imprisonment.   He also continues to struggle with accepting that he is an addict and says that his tendency has always been to solve things on his own so he keeps thinking that he Isaiah Arnold be able to do the same with his addiction.  Isaiah Arnold notes that the video helps him to see that his beliefs about what an addict looks like need to change. He says that the discussion on rituals is so spot on that it caused him to start having his first strong cravings to use in weeks.   He is encouraged to keep attending  meetings and reminded that the physical symptoms and cravings he is experiencing will get better or time with Isaiah Arnold noting that he is a little better today than he was in last group.     Progress Towards Goals: Isaiah Arnold denies any drug use.    UDS collected: No Results: No   AA/NA attended?: Yes   Sponsor?: No   Myrna Blazer, MA, LCSW, [facility], LCAS 02/09/2022

## 2022-02-12 ENCOUNTER — Other Ambulatory Visit (HOSPITAL_COMMUNITY): Payer: 59 | Attending: Medical | Admitting: Licensed Clinical Social Worker

## 2022-02-12 ENCOUNTER — Encounter (HOSPITAL_COMMUNITY): Payer: Self-pay | Admitting: Licensed Clinical Social Worker

## 2022-02-12 DIAGNOSIS — Z6372 Alcoholism and drug addiction in family: Secondary | ICD-10-CM | POA: Diagnosis not present

## 2022-02-12 DIAGNOSIS — F1420 Cocaine dependence, uncomplicated: Secondary | ICD-10-CM | POA: Insufficient documentation

## 2022-02-12 DIAGNOSIS — Z811 Family history of alcohol abuse and dependence: Secondary | ICD-10-CM | POA: Diagnosis not present

## 2022-02-12 DIAGNOSIS — F1590 Other stimulant use, unspecified, uncomplicated: Secondary | ICD-10-CM | POA: Diagnosis not present

## 2022-02-12 DIAGNOSIS — F1320 Sedative, hypnotic or anxiolytic dependence, uncomplicated: Secondary | ICD-10-CM | POA: Insufficient documentation

## 2022-02-12 DIAGNOSIS — F1120 Opioid dependence, uncomplicated: Secondary | ICD-10-CM | POA: Insufficient documentation

## 2022-02-12 DIAGNOSIS — F4312 Post-traumatic stress disorder, chronic: Secondary | ICD-10-CM | POA: Diagnosis not present

## 2022-02-12 DIAGNOSIS — F1920 Other psychoactive substance dependence, uncomplicated: Secondary | ICD-10-CM | POA: Insufficient documentation

## 2022-02-12 DIAGNOSIS — Z62811 Personal history of psychological abuse in childhood: Secondary | ICD-10-CM | POA: Diagnosis not present

## 2022-02-12 DIAGNOSIS — Z818 Family history of other mental and behavioral disorders: Secondary | ICD-10-CM | POA: Insufficient documentation

## 2022-02-12 DIAGNOSIS — F1220 Cannabis dependence, uncomplicated: Secondary | ICD-10-CM | POA: Diagnosis not present

## 2022-02-12 NOTE — Progress Notes (Signed)
Daily Group Progress Note   Program: CD IOP     Group Time: 9 a.m. to 12 p.m.   Type of Therapy: Process and Psychoeducational    Topic: The therapist checks in with group members, assesses for SI/HI/psychosis and overall level of functioning. The therapist inquires about sobriety date and number of community support meetings attended since last session.   The therapist introduces a new member to group and has group members introduce themselves discussing how they came to be in [facility].  The therapist presents Matrix Module RP 2 on Boredom having group members discuss the questions posed in this module.    Summary: Nikalas presents for group rating his depression as a "0" and anxiety as a "0" with no SI or HI.   He says that he has attended a meeting every day but Friday but does not have a Sponsor yet as she is trying to avoid forcing anything.   He says that his continues to feel better and that thus far when he has attended meetings that he always hears what he needs to hear.   Keisean says that he got a lot out of the video from last group regarding denial. He says that his substance use was causing problems in his marriage to the point that his wife was talking about divorce.   He seems to bond with a new group member who is almost the same range, also has a history of working in [employer], and has minor children. Carlyle and this member make the observation that their children, who are toddlers, had started to become aware of their use in their own way which serves as a motivation to stop using.    Progress Towards Goals: Finbar denies any drug use.    UDS collected: No Results: No   AA/NA attended?: Yes   Sponsor?: No   Myrna Blazer, MA, LCSW, [facility], LCAS [id number]

## 2022-02-14 ENCOUNTER — Other Ambulatory Visit (HOSPITAL_COMMUNITY): Payer: Self-pay | Admitting: Medical

## 2022-02-14 ENCOUNTER — Other Ambulatory Visit (HOSPITAL_COMMUNITY): Payer: 59 | Attending: Medical | Admitting: Licensed Clinical Social Worker

## 2022-02-14 DIAGNOSIS — F4312 Post-traumatic stress disorder, chronic: Secondary | ICD-10-CM

## 2022-02-14 DIAGNOSIS — Z6372 Alcoholism and drug addiction in family: Secondary | ICD-10-CM | POA: Diagnosis not present

## 2022-02-14 DIAGNOSIS — Z818 Family history of other mental and behavioral disorders: Secondary | ICD-10-CM

## 2022-02-14 DIAGNOSIS — F1420 Cocaine dependence, uncomplicated: Secondary | ICD-10-CM | POA: Diagnosis not present

## 2022-02-14 DIAGNOSIS — F1220 Cannabis dependence, uncomplicated: Secondary | ICD-10-CM | POA: Diagnosis not present

## 2022-02-14 DIAGNOSIS — F1021 Alcohol dependence, in remission: Secondary | ICD-10-CM

## 2022-02-14 DIAGNOSIS — T7432XS Child psychological abuse, confirmed, sequela: Secondary | ICD-10-CM

## 2022-02-14 DIAGNOSIS — F1120 Opioid dependence, uncomplicated: Secondary | ICD-10-CM

## 2022-02-14 DIAGNOSIS — F1320 Sedative, hypnotic or anxiolytic dependence, uncomplicated: Secondary | ICD-10-CM | POA: Insufficient documentation

## 2022-02-14 DIAGNOSIS — Z811 Family history of alcohol abuse and dependence: Secondary | ICD-10-CM

## 2022-02-14 DIAGNOSIS — F1510 Other stimulant abuse, uncomplicated: Secondary | ICD-10-CM | POA: Insufficient documentation

## 2022-02-14 DIAGNOSIS — F1920 Other psychoactive substance dependence, uncomplicated: Secondary | ICD-10-CM

## 2022-02-14 DIAGNOSIS — F32A Depression, unspecified: Secondary | ICD-10-CM | POA: Insufficient documentation

## 2022-02-14 DIAGNOSIS — F1020 Alcohol dependence, uncomplicated: Secondary | ICD-10-CM | POA: Insufficient documentation

## 2022-02-14 DIAGNOSIS — F1590 Other stimulant use, unspecified, uncomplicated: Secondary | ICD-10-CM

## 2022-02-14 MED ORDER — BUPROPION HCL ER (SR) 150 MG PO TB12: 150.0000 mg | ORAL_TABLET | Freq: Two times a day (BID) | ORAL | 2 refills | Status: DC

## 2022-02-14 NOTE — Progress Notes (Signed)
Patient ID: Isaiah Arnold, male   DOB: December 10, 1985, 36 y.o.   MRN: [id number] Pt c/o severe fatigue/Dysthymia from family of alcoholics most likely
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
## 2022-02-16 ENCOUNTER — Other Ambulatory Visit (HOSPITAL_COMMUNITY): Payer: 59 | Attending: Psychiatry | Admitting: Licensed Clinical Social Worker

## 2022-02-16 DIAGNOSIS — F1220 Cannabis dependence, uncomplicated: Secondary | ICD-10-CM

## 2022-02-16 DIAGNOSIS — F1920 Other psychoactive substance dependence, uncomplicated: Secondary | ICD-10-CM | POA: Diagnosis present

## 2022-02-16 DIAGNOSIS — F1320 Sedative, hypnotic or anxiolytic dependence, uncomplicated: Secondary | ICD-10-CM

## 2022-02-16 DIAGNOSIS — F1590 Other stimulant use, unspecified, uncomplicated: Secondary | ICD-10-CM

## 2022-02-16 DIAGNOSIS — F1420 Cocaine dependence, uncomplicated: Secondary | ICD-10-CM

## 2022-02-16 NOTE — Progress Notes (Signed)
Daily Group Progress Note   Program: CD IOP     Group Time: 9 a.m. to 12 p.m.   Type of Therapy: Process and Psychoeducational    Topic: The therapist checks in with group members, assesses for SI/HI/psychosis and overall level of functioning. The therapist inquires about sobriety date and number of community support meetings attended since last session.   The therapist facilitates a discussion on a range of topics including how genetics impacts whether or not a person can tolerate a particular medication and how what medications other family members are taking for the same condition or genetic testing can help zero in on the right medication for a particular person sooner.  The therapist talks about family dynamics of addiction noting that many addicts are either married to another addict or married to an ACOA.   He discusses that people have different ways of coping with stress in that some people are monitors and some are blunters and that there is not one-size-fits all treatment in that a therapist or Sponsor who may be a good fit for one person may be a terrible fit for another. The therapist validates that not everyone gets sober in [place] or NA; however, he does emphasize that research says that recovery takes place in Fellowship meaning that people in recovery need non-using supports and non-using hobbies and activities. The therapist explains the benefits of working the steps emphasizing that they are not designed to have a person wallow in his or her misery but to help the recovering person let go of guilty and shame and identify and remove character defects that can lead to relapse.  The therapist presents Matrix Modules RP 3A Avoiding Relapse Drift and RP 3B Mooring Lines Recovery Chart.    Summary: Isaiah Arnold presents for group rating his depression as a "0" and anxiety as a "0" with no SI or HI.   He started Wellbutrin yesterday saying that he has a mild headache but took some Tylenol and  that it went away. He denies having a headache and says that he is feeling better overall than he has before and is now able to play with his children.  He talks about his mom's substance use. When the therapist asks what his mom's response is when he pointed out that she kicked him out as a teen for using while uses herself, he says that she will respond stating, "How dare you disrespect me."   He has not been to any meetings but says that he will tonight but then admits to having "mixed feelings" about attending AA or NA. He has only been to NA and says that he does not have anything in common with people in NA and that some of the people seem obsessed with the program. He also has concerns about doing the steps based on his stay at [facility] in which things were being pried out of him and he was then told that he needed grief counseling.   He is agreeable to trying an AA meeting. In discussing relapses drift and mooring lines, he says that he needs to stay busy doing chores and has to avoid sitting and staying on the phone too long and avoid talking to his ex.    Progress Towards Goals: Isaiah Arnold denies any drug use.    UDS collected: No Results: Yes; positive for cannabis on his first UDS but negative for drugs and alcohol on his 2nd   AA/NA attended?: No   Sponsor?: No  Myrna Blazer, MA, LCSW, [facility], LCAS 02/16/2022

## 2022-02-19 ENCOUNTER — Encounter (HOSPITAL_COMMUNITY): Payer: Self-pay | Admitting: Licensed Clinical Social Worker

## 2022-02-19 ENCOUNTER — Other Ambulatory Visit (HOSPITAL_COMMUNITY): Payer: 59 | Attending: Medical | Admitting: Licensed Clinical Social Worker

## 2022-02-19 DIAGNOSIS — F1420 Cocaine dependence, uncomplicated: Secondary | ICD-10-CM

## 2022-02-19 DIAGNOSIS — F1590 Other stimulant use, unspecified, uncomplicated: Secondary | ICD-10-CM

## 2022-02-19 DIAGNOSIS — F419 Anxiety disorder, unspecified: Secondary | ICD-10-CM | POA: Diagnosis present

## 2022-02-19 DIAGNOSIS — F1220 Cannabis dependence, uncomplicated: Secondary | ICD-10-CM

## 2022-02-19 DIAGNOSIS — F1320 Sedative, hypnotic or anxiolytic dependence, uncomplicated: Secondary | ICD-10-CM

## 2022-02-19 NOTE — Progress Notes (Signed)
Daily Group Progress Note   Program: CD IOP     Group Time: 9 a.m. to 12 p.m.   Type of Therapy: Process and Psychoeducational    Topic: The therapist checks in with group members, assesses for SI/HI/psychosis and overall level of functioning. The therapist inquires about sobriety date and number of community support meetings attended since last session.   The therapist covers Matrix Modules RP 4 Work and Recovery and RP 5 Guilt and Shame.   Summary: Isaiah Arnold presents for group rating his depression as a "0" and anxiety as a "4-5" with no SI or HI.   He says that he attended an AA meeting and a NA meeting over the weekend. He says that the AA meeting was good but had all "old guys." Two other group members make Isaiah Arnold aware of some AA meetings which may be a better fit for him. He says that one of the meetings was very small such that there was a round robin so he had to share for the first time which he notes was probably good.   He believes that the Wellbutrin has been causing him to feel irritable, have a dry mouth, is interrupting his sleep, and is causing him some sort of bowel difficulties so he dropped his dose from 300 mg to 150 mg. He says that he needs to talk to the P.A. about this and his sleep. He says that he sleeps poorly and has lots of nightmares.  He notes that his wife believes that he needs to be more emotionally available so the therapist offers to facilitate a family session with Isaiah Arnold saying that he could invite her. He says that he has started using one of the group schedules and that he is going to lunch today, then to the gym, and then for an appointment for his shoulder. He is also going to take advantage of three, free sessions with a therapist through [place]. 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   He is returning to work noting that his co-workers will support his recovery. He feels guilty about things he  has done while using and ashamed of being dependent on substances and weak because he could not stop.    Progress Towards Goals: [name] denies any drug use.    UDS collected: Yes Results: No   AA/NA attended?: Yes   Sponsor?: No   Myrna Blazer, MA, LCSW, [facility], LCAS 02/19/2022

## 2022-02-21 ENCOUNTER — Other Ambulatory Visit (HOSPITAL_COMMUNITY): Payer: 59 | Attending: Medical | Admitting: Licensed Clinical Social Worker

## 2022-02-21 DIAGNOSIS — F1421 Cocaine dependence, in remission: Secondary | ICD-10-CM | POA: Diagnosis not present

## 2022-02-21 DIAGNOSIS — T7432XS Child psychological abuse, confirmed, sequela: Secondary | ICD-10-CM

## 2022-02-21 DIAGNOSIS — F1120 Opioid dependence, uncomplicated: Secondary | ICD-10-CM

## 2022-02-21 DIAGNOSIS — F1521 Other stimulant dependence, in remission: Secondary | ICD-10-CM | POA: Diagnosis not present

## 2022-02-21 DIAGNOSIS — Z6372 Alcoholism and drug addiction in family: Secondary | ICD-10-CM | POA: Diagnosis not present

## 2022-02-21 DIAGNOSIS — F1590 Other stimulant use, unspecified, uncomplicated: Secondary | ICD-10-CM

## 2022-02-21 DIAGNOSIS — F1420 Cocaine dependence, uncomplicated: Secondary | ICD-10-CM

## 2022-02-21 DIAGNOSIS — F1320 Sedative, hypnotic or anxiolytic dependence, uncomplicated: Secondary | ICD-10-CM

## 2022-02-21 DIAGNOSIS — F1220 Cannabis dependence, uncomplicated: Secondary | ICD-10-CM

## 2022-02-21 DIAGNOSIS — F1121 Opioid dependence, in remission: Secondary | ICD-10-CM | POA: Diagnosis present

## 2022-02-21 DIAGNOSIS — F1221 Cannabis dependence, in remission: Secondary | ICD-10-CM | POA: Diagnosis not present

## 2022-02-21 DIAGNOSIS — F4312 Post-traumatic stress disorder, chronic: Secondary | ICD-10-CM | POA: Diagnosis not present

## 2022-02-21 DIAGNOSIS — Z811 Family history of alcohol abuse and dependence: Secondary | ICD-10-CM

## 2022-02-21 DIAGNOSIS — F1920 Other psychoactive substance dependence, uncomplicated: Secondary | ICD-10-CM

## 2022-02-21 DIAGNOSIS — Z818 Family history of other mental and behavioral disorders: Secondary | ICD-10-CM
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
## 2022-02-21 NOTE — Progress Notes (Signed)
Daily Group Progress Note   Program: CD IOP     Group Time: 9 a.m. to 12 p.m.   Type of Therapy: Process and Psychoeducational    Topic: The therapist checks in with group members, assesses for SI/HI/psychosis and overall level of functioning. The therapist inquires about sobriety date and number of community support meetings attended since last session.    The therapist shows a recovery video dealing with guilt and shame and embarrassment and humiliation. The therapist talks about the differences between each and facilitates a discussion on how shame can become part of the addiction relapse cycle and how shame over being a person with addiction is not congruent with the fact that addiction is a brain-based disease and not a character defect.  The therapist also shows a video aimed at illustrating that if group members had chosen a different path that their lives would likely have had new or different positives; however, new and different problems and challenges as well. The therapist discusses the saying, "You are exactly where your Higher Power wants you to be."    Summary: Ahaan presents for group rating his depression as a "0" and anxiety as a "2" with no SI or HI.   He says that he attended a NA meeting yesterday and that the people there were older than him which was unusual; however, he liked that there were only 7 people as he tends to like smaller groups.  Aydin is feeling much better on 150 mg of Wellbutrin. He is only sleeping 6 hours with 7-8 being the norm for him but is waking up feeling rested. 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 His Chef says that he has been "clean" for 3 weeks now as he is following Matei's lead.  Kamali says that he is spending more time with his wife and that he went to the gym yesterday as well. He talks about his feeling bad that he is not making enough money such that he can save and such that  his wife does not have to work her part-time job. He also says that his job forces him to work from 2 p.m. until late into the evening which interrupts his ability to spend time with his kids, attend their events, and help his wife care for his kids.  The therapist notes that Chamar is currently able to pay his bills and in many ways is like most Americans. The therapist also points out that things will change when his latency age kids are older and more independent no requiring so much care.  He says that he could possibly explore other options such as Nutritional therapist but realizes that he likely needs to wait given that he is so early into recovery.  Milus says that he found the discussion on shame helpful and realizes that he must work to let go of shame identifying his mother as the source of much of it.  Progress Towards Goals: Erric denies any drug use.    UDS collected: No Results: No   AA/NA attended?: Yes   Sponsor?: No   Myrna Blazer, MA, LCSW, [facility], LCAS 02/21/2022

## 2022-02-23 ENCOUNTER — Other Ambulatory Visit (HOSPITAL_COMMUNITY): Payer: 59 | Attending: Psychiatry | Admitting: Licensed Clinical Social Worker

## 2022-02-23 DIAGNOSIS — F1520 Other stimulant dependence, uncomplicated: Secondary | ICD-10-CM | POA: Insufficient documentation

## 2022-02-23 DIAGNOSIS — F1320 Sedative, hypnotic or anxiolytic dependence, uncomplicated: Secondary | ICD-10-CM | POA: Insufficient documentation

## 2022-02-23 DIAGNOSIS — F32A Depression, unspecified: Secondary | ICD-10-CM | POA: Insufficient documentation

## 2022-02-23 DIAGNOSIS — F419 Anxiety disorder, unspecified: Secondary | ICD-10-CM | POA: Insufficient documentation

## 2022-02-23 DIAGNOSIS — F1420 Cocaine dependence, uncomplicated: Secondary | ICD-10-CM | POA: Diagnosis not present

## 2022-02-23 DIAGNOSIS — F1220 Cannabis dependence, uncomplicated: Secondary | ICD-10-CM | POA: Diagnosis not present

## 2022-02-23 DIAGNOSIS — F1590 Other stimulant use, unspecified, uncomplicated: Secondary | ICD-10-CM

## 2022-02-23 NOTE — Progress Notes (Signed)
Daily Group Progress Note   Program: CD IOP     Group Time: 9 a.m. to 12 p.m.   Type of Therapy: Process and Psychoeducational    Topic: The therapist checks in with group members, assesses for SI/HI/psychosis and overall level of functioning. The therapist inquires about sobriety date and number of community support meetings attended since last session.    The therapist show a video on shame and guilt discussing the need for self-compassion and the need to reach out versus act out.    The therapist begins Matrix Module PR 6 Staying Busy.    Summary: Isaiah Arnold presents for group rating his depression as a "3" and anxiety as a "3" with no SI or HI.  Isaiah Arnold says that he is back to work and that his energy is not good and that he is having a lot of physical pain in random parts of his body. He continues to have sleep problems with bad nightmares.  He says that the P.A. suggested that he could take Melatonin or try Bendadryl. Isaiah Arnold is apparently unfamiliar with the fact that Diphenhydramine is the active sleep ingredient in most the over-the-counter sleep aids. The therapist suggests that if Isaiah Arnold is concerned about too much sedation that he could try taking 25 mg on a night before a day that he does not have to work. The therapist suggests that if this does not work that he may need to see the P.A. about a prescribed sleep aid.  Isaiah Arnold says that work is his biggest trigger with the therapist asking what Isaiah Arnold can do to not use and coming up with the plan to call the guy he met in the program.   Group members provide Isaiah Arnold feedback about his job situation suggesting that making a change seems like a good long-term goal as his wife apparently wants him to do so; however, it is suggested that he perhaps not tackle this change right now as he is not in a place to explore other options and Isaiah Arnold notes that it has been recommended to not make big changes early in recovery.   Isaiah Arnold says that he will have to miss  group next Wednesday as his wife is having surgery. He would be interested in having her attend a family session once she has recovered.    He says that he is having no problems with staying busy but not being busy is what led to his relapse after he went to [place] 10 years ago. He struggles with overcoming toxic shame.   Progress Towards Goals: Isaiah Arnold denies any drug use.    UDS collected: No Results: Yes; negative for drugs or alcohol   AA/NA attended?: No  Sponsor?: No   Isaiah Arnold, [place], LCSW, [facility], LCAS 02/23/2022

## 2022-03-01 ENCOUNTER — Encounter (HOSPITAL_COMMUNITY): Payer: Self-pay | Admitting: Licensed Clinical Social Worker

## 2022-03-01 ENCOUNTER — Other Ambulatory Visit (HOSPITAL_COMMUNITY): Payer: Self-pay | Admitting: Medical

## 2022-03-01 NOTE — Telephone Encounter (Signed)
Should have refill 9/15

## 2022-03-02 ENCOUNTER — Other Ambulatory Visit (HOSPITAL_COMMUNITY): Payer: 59 | Attending: Psychiatry | Admitting: Licensed Clinical Social Worker

## 2022-03-02 DIAGNOSIS — F1420 Cocaine dependence, uncomplicated: Secondary | ICD-10-CM | POA: Diagnosis present

## 2022-03-02 DIAGNOSIS — F1520 Other stimulant dependence, uncomplicated: Secondary | ICD-10-CM | POA: Insufficient documentation

## 2022-03-02 DIAGNOSIS — F1590 Other stimulant use, unspecified, uncomplicated: Secondary | ICD-10-CM

## 2022-03-02 DIAGNOSIS — F1220 Cannabis dependence, uncomplicated: Secondary | ICD-10-CM | POA: Insufficient documentation

## 2022-03-02 DIAGNOSIS — F1320 Sedative, hypnotic or anxiolytic dependence, uncomplicated: Secondary | ICD-10-CM | POA: Diagnosis not present

## 2022-03-02 NOTE — Progress Notes (Signed)
Daily Group Progress Note   Program: CD IOP     Group Time: 9 a.m. to 12 p.m.   Type of Therapy: Process and Psychoeducational    Topic: The therapist checks in with group members, assesses for SI/HI/psychosis and overall level of functioning. The therapist inquires about sobriety date and number of community support meetings attended since last session.    The therapist has the two group members who were not present on Wednesday to introduce themselves to the two new group members who started that day and vice versa.  The therapist educates the new members on the different types of Twelve Step meetings as do other group members. The therapist focuses on why marijuana use is considered no different than alcohol or any other drug use showing a video that illustrates that the distinction between a soft and hard drug is a myth. The therapist also discusses the reason that people in recovery should avoid any substance that activates the addictive pathway in the brain.   The therapist shows a group a video on the importance of honesty at the conclusion of group eliciting feedback from members.    Summary: Isaiah Arnold presents for group rating his depression as a "0" and anxiety as a "0" with no SI or HI.  He says that his wife's surgery went well. He has attended a couple of meetings lately noting that he is "liking it more."   He shares his history of substance use explaining to a new member who does not consider pot to be a problem that he cannot say the same himself. Isaiah Arnold explains that his resumption of smoking marijuana eventually led back to his using an array of substances. He says that his kids could likely smell the marijuana on his clothes and he is motivated to quit in large part for them.   He started the Benadryl at 25 mg and is now taking 50 mg saying that it has greatly improved his sleep which in turn has helped him to feel much better overall.   Isaiah Arnold says that so far that he has found  success in [facility] IOP and that it is a better fit for him than residential treatment was. He says that residential treatment was not only too costly but he was also put off by having to share his 4th Step with a random stranger.   He says that he is now looking for a Marketing executive.   Progress Towards Goals: [name] denies any drug use.    UDS collected: Yes Results: No   AA/NA attended?: Yes Sponsor?: No   Myrna Blazer, MA, LCSW, [facility], LCAS 03/02/2022

## 2022-03-05 ENCOUNTER — Other Ambulatory Visit (HOSPITAL_COMMUNITY): Payer: 59 | Attending: Medical | Admitting: Licensed Clinical Social Worker

## 2022-03-05 DIAGNOSIS — F1420 Cocaine dependence, uncomplicated: Secondary | ICD-10-CM | POA: Insufficient documentation

## 2022-03-05 DIAGNOSIS — F1320 Sedative, hypnotic or anxiolytic dependence, uncomplicated: Secondary | ICD-10-CM | POA: Diagnosis not present

## 2022-03-05 DIAGNOSIS — F1220 Cannabis dependence, uncomplicated: Secondary | ICD-10-CM | POA: Diagnosis not present

## 2022-03-05 DIAGNOSIS — F1590 Other stimulant use, unspecified, uncomplicated: Secondary | ICD-10-CM | POA: Insufficient documentation

## 2022-03-05 MED ORDER — VORTIOXETINE HBR 10 MG PO TABS: 10.0000 mg | ORAL_TABLET | Freq: Every day | ORAL | 2 refills | Status: DC

## 2022-03-05 NOTE — Progress Notes (Signed)
Daily Group Progress Note   Program: CD IOP     Group Time: 9 a.m. to 12 p.m.   Type of Therapy: Process and Psychoeducational    Topic: The therapist checks in with group members, assesses for SI/HI/psychosis and overall level of functioning. The therapist inquires about sobriety date and number of community support meetings attended since last session.    The therapist talks to the group members about the reason that males have male Sponsors and females have male Sponsors and why males give out their numbers to males on and the same for male members of AA and NA. The therapist explains what is meant by the 13th Step and the reason that newcomers and encouraged to not get into romantic relationships with each other.   The therapist explains the reason that using any illicit substance calls for a new sobriety date and the reason that group members are asked to abstain from all addictive substances at least while attending SA IOP.   The therapist focuses a great deal of time regarding how difficult it is to give up relationships with substance using friends and/or family and how difficult it is to start new relationships while not using substances but how doing so is vitally important to one's recovery.   The therapist completes Matrix module RP 8 on Honesty and shows a short video on the ten most common lies that a person in addiction tells him or herself.    Summary: Isaiah Arnold Arnold presents for group rating his depression as a "0" and anxiety as a "0" with no SI or HI.  Isaiah Arnold has not attended any meetings and notes that his wife received bad news in that she has to get a hysterectomy. Isaiah Arnold Arnold says that Isaiah Arnold believes that his Wellbutrin is causing him to grind his teeth or jaw constantly like when Isaiah Arnold took Adderall. Isaiah Arnold says that Isaiah Arnold has had extreme difficulty opening up to people at meetings though Isaiah Arnold really wants to do so. Isaiah Arnold says that Isaiah Arnold has never had a healthy relationship with a man as his father was absent  from his life. Isaiah Arnold has spoken to some women at the meetings but admits that Isaiah Arnold feels at risk of getting into an emotional affair with them. Isaiah Arnold is staying away from his guy friends who all use while at the same time knowing that Isaiah Arnold cannot talk to females as Isaiah Arnold has been doing. Isaiah Arnold says that Isaiah Arnold has been keeping away from Isaiah Arnold Arnold as Isaiah Arnold is "so anxious in meetings." The therapist elicits feedback from group members with another group member admitting that Isaiah Arnold feels the same way that Isaiah Arnold Arnold does at meetings and is relieved to know that Isaiah Arnold is not alone. The therapist talks to Isaiah Arnold Arnold about admitting that Isaiah Arnold is having these feelings as opposed to trying to hide it.   Isaiah Arnold also says that his ex, who is married too, has been messaging him with Isaiah Arnold Arnold wanting to have contact with her while knowing that Isaiah Arnold should not. Isaiah Arnold says that she has told him that if Isaiah Arnold asks her to stop contacting him that she will.   After meeting with the P.A. today, Isaiah Arnold says that Isaiah Arnold needs to set up an individual appointment with this therapist as the P.A. told Isaiah Arnold that Isaiah Arnold needs to work on getting to the root of his anxiety; however, Isaiah Arnold Arnold says that Isaiah Arnold does not know how to address this or what to discuss in therapy saying that Isaiah Arnold tends to overanalyze things.  Progress Towards Goals: Isaiah Arnold Arnold denies any drug use.    UDS collected: Yes Results: No   AA/NA attended?: No Sponsor?: No  Individual Time: 12 p.m. to 12:55 p.m.  The therapist ends up meeting with Isaiah Arnold for an individual session after group though also scheduling to see him tomorrow, 03/06/22. Isaiah Arnold says that Isaiah Arnold wants to get to a point that Isaiah Arnold feels confident in his own skin and does not worry about how people view him. For example, Isaiah Arnold is concerned that when Isaiah Arnold shares at meeting that Isaiah Arnold is not "manly enough" or people will hear the anxiety in his voice.  During this meeting, it becomes apparent that a great deal of Isaiah Arnold's anxiety stems from his marriage which Isaiah Arnold admits is 70% of his anxiety. Isaiah Arnold says  that his wife is 7 years his senior and that they have little in common. Isaiah Arnold says that when they met that Isaiah Arnold was drinking and that they had good sexual chemistry and that she was funny. Now, Isaiah Arnold says that she does not worry about keeping herself up and is very negative and has no life outside of their children. She wants Isaiah Arnold Arnold to be affectionate; however, Isaiah Arnold admits that Isaiah Arnold does not want to do so.   Isaiah Arnold says that they had kids before getting married and did not plan birth control during their relationship. Isaiah Arnold feels "trapped" in part due to the kids and also says that Isaiah Arnold would not feel terrible if Isaiah Arnold left her with her recent cancer diagnosis and hysterectomy with the cancer being pre-cancerous and not serious per the doctor.   It is unclear how things would work out financially if Isaiah Arnold were to leave, get his own place, and have to pay child support as Isaiah Arnold notes that bills are tight now. Isaiah Arnold says that his wife would have to return to work full-time if this were to happen.   Isaiah Arnold Arnold says that Isaiah Arnold has started listening to Healing the Shame that Binds You on audio and is on Chapter 5.    Marissa Phenix, [place], LCSW, [facility], [place] 03/05/2022

## 2022-03-05 NOTE — Progress Notes (Signed)
   [facility] Follow-up Outpatient CDIOP Date:   Admission Date:  Sobriety date:  Subjective:   HPI   Review of Systems: Psychiatric: Agitation: No Hallucination: No Depressed Mood: Yes much better Insomnia: No Hypersomnia: No Altered Concentration: No Feels Worthless: No Grandiose Ideas: No Belief In Special Powers: No New/Increased Substance Abuse: No Compulsions: No  Neurologic: Headache: No Seizure: No Paresthesias: No  Current Medications:   Vital Signs  Mental Status Examination  Appearance: Alert: Yes Attention: good  Cooperative: Yes Eye Contact: Good Speech: Clear and coherent Psychomotor Activity: Normal Memory/Concentration: Normal/intact Oriented: person, place, time/date and situation Mood: Euthymic Affect: Appropriate and Congruent Thought Processes and Associations: Coherent and Intact Fund of Knowledge: Good Thought Content: WDL Insight: Good Judgement: Good  UDS:  PDMP:  Diagnosis:   Assessment:  Treatment Plan: [name], PA-C Patient ID: Isaiah Arnold, male   DOB: 14-Mar-1986, 36 y.o.   MRN: [id number]

## 2022-03-06 ENCOUNTER — Ambulatory Visit (INDEPENDENT_AMBULATORY_CARE_PROVIDER_SITE_OTHER): Payer: 59 | Admitting: Licensed Clinical Social Worker

## 2022-03-06 DIAGNOSIS — F1590 Other stimulant use, unspecified, uncomplicated: Secondary | ICD-10-CM

## 2022-03-06 DIAGNOSIS — F1320 Sedative, hypnotic or anxiolytic dependence, uncomplicated: Secondary | ICD-10-CM

## 2022-03-06 DIAGNOSIS — F1220 Cannabis dependence, uncomplicated: Secondary | ICD-10-CM

## 2022-03-06 DIAGNOSIS — F1420 Cocaine dependence, uncomplicated: Secondary | ICD-10-CM
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
## 2022-03-07 ENCOUNTER — Other Ambulatory Visit (HOSPITAL_COMMUNITY): Payer: 59 | Attending: Medical | Admitting: Licensed Clinical Social Worker

## 2022-03-07 DIAGNOSIS — F1590 Other stimulant use, unspecified, uncomplicated: Secondary | ICD-10-CM

## 2022-03-07 DIAGNOSIS — F1220 Cannabis dependence, uncomplicated: Secondary | ICD-10-CM

## 2022-03-07 DIAGNOSIS — F1420 Cocaine dependence, uncomplicated: Secondary | ICD-10-CM

## 2022-03-07 DIAGNOSIS — R45851 Suicidal ideations: Secondary | ICD-10-CM | POA: Diagnosis present

## 2022-03-07 DIAGNOSIS — F1320 Sedative, hypnotic or anxiolytic dependence, uncomplicated: Secondary | ICD-10-CM

## 2022-03-07 NOTE — Progress Notes (Signed)
Daily Group Progress Note   Program: CD IOP     Group Time: 9 a.m. to 12 p.m.   Type of Therapy: Process and Psychoeducational    Topic: The therapist checks in with group members, assesses for SI/HI/psychosis and overall level of functioning. The therapist inquires about sobriety date and number of community support meetings attended since last session.    In response to issues brought up by group members in open discussion, the therapist talks about how one can go about finding a Sponsor noting that if a Sponsor says "no" to a request from a person to be his or her Sponsor that some people take this as a rejection when often the Sponsor says "no" because he or she already has too many Sponsees or is not taking Sponsees due to a personal issue in his or her life. The therapist says that often the Sponsor saying "no" can recommend another good Sponsor who is taking Sponsees.   The therapist answers group members' questions on MAT educating them about oral Naltrexone versus Vivitrol and Antabuse which is typically no longer used.   The therapist covers Matrix modules RP 9 Total Abstinence, RP 10 Sex and Recovery, and begins RP 11 Anticipating and Preventing Relapse. The therapist presents data which supports that being "[place]" is likely not a good plan. The therapist also shows videos on the stages of relapse focusing on emotional relapse highlighting the need to talk about problematic life circumstances with other people even when the person is not thinking of drinking of using or having cravings. The therapist observes that most people do not feel like they should speak up unless they are in mental relapse and thus thinking about using.    Summary: Lovelace presents for group rating his depression as a "0" and anxiety as a "0" with no SI or HI.  Albin attended a meeting yesterday. He says that he has run across a couple of potential Sponsors but says that he is not sure if they are taking  Sponsees with the therapist suggesting the best way to know is to ask.  Richie shares about his wife's cancer diagnosis but other than this reports that things are good in contrast to previous groups. 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   Progress Towards Goals: Thang denies any drug use.    UDS collected: Yes Results: No   AA/NA attended?: Yes  Sponsor?: No    Myrna Blazer, MA, LCSW, [facility], LCAS 03/07/2022

## 2022-03-08 ENCOUNTER — Other Ambulatory Visit (HOSPITAL_COMMUNITY): Payer: Self-pay | Admitting: Medical

## 2022-03-09 ENCOUNTER — Other Ambulatory Visit (HOSPITAL_COMMUNITY): Payer: 59 | Attending: Psychiatry | Admitting: Licensed Clinical Social Worker

## 2022-03-09 DIAGNOSIS — F1499 Cocaine use, unspecified with unspecified cocaine-induced disorder: Secondary | ICD-10-CM | POA: Insufficient documentation

## 2022-03-09 DIAGNOSIS — F1320 Sedative, hypnotic or anxiolytic dependence, uncomplicated: Secondary | ICD-10-CM

## 2022-03-09 DIAGNOSIS — F1590 Other stimulant use, unspecified, uncomplicated: Secondary | ICD-10-CM | POA: Diagnosis not present

## 2022-03-09 DIAGNOSIS — F1299 Cannabis use, unspecified with unspecified cannabis-induced disorder: Secondary | ICD-10-CM | POA: Diagnosis not present

## 2022-03-09 DIAGNOSIS — F1220 Cannabis dependence, uncomplicated: Secondary | ICD-10-CM

## 2022-03-09 DIAGNOSIS — F1420 Cocaine dependence, uncomplicated: Secondary | ICD-10-CM

## 2022-03-09 NOTE — Progress Notes (Signed)
Daily Group Progress Note   Program: CD IOP     Group Time: 9 a.m. to 12 p.m.   Type of Therapy: Process and Psychoeducational    opic: The therapist checks in with group members, assesses for SI/HI/psychosis and overall level of functioning. The therapist inquires about sobriety date and number of community support meetings attended since last session.    The therapist facilitates a discussion regarding a number of topics including using dreams, sugar cravings in early recovery, and the need to put as much energy into one's recovery as was put into drinking and/or getting high. The therapist how to deal with toxic parents and/or family members.  The therapist continues presenting information from RP 11 Anticipating and Preventing Relapse focusing mostly on emotional buildup. The therapist has group members review the emotion wheel and talks about the reason that it is important for people in early recovery to begin to name the emotions they are feeling and to use mindfulness skills to begin paying attention to their emotional state.   Summary: Isaiah Arnold presents for group rating his depression as a "0" and anxiety as a "0" with no SI or HI.  He looks extremely groggy during the initial half of group noting that he had to get up early to get a shot to help with his poison ivy. He has not attended a meeting since Tuesday and shares the content of his using dreams and dreams of being chased.  He becomes extremely animated in the latter part of the group when talking about how his mother gives unsolicited advice and how she does not want to keep his 36 year old but wants the older two children. He describes her as being self-centered and controlling and eventually admits that he hates his mother. He says that it feels good to admit this. The therapist suggests that Isaiah Arnold may need to start setting limits with his mother especially as Isaiah Arnold is concerned about how her actions may be negatively  impacting the kids emotionally.  He says that he has been wanting to use lately due to his Arnold's cancer and his Arnold's medical bills and his own. He says that SA IOP is going to cost more than Fellowship Isaiah Arnold due to the $300 per week charge for the drug testing. He says that he has not used as he would be letting himself down. The therapist notes that Isaiah Arnold has not gotten a bill from Isaiah Arnold and nor should he as the drug testing is through the SA IOP.   In talking about emotional buildup, the therapist notes that Isaiah Arnold has described several emotions that would constitute emotional buildup. Isaiah Arnold says that when he was at Isaiah Arnold that they had to identify two emotions they were feeling each day saying that this got people talking and that addiction is disease of the emotions.   Progress Towards Goals: Isaiah Arnold denies any drug use.    UDS collected: No Results: No   AA/NA attended?: Yes  Sponsor?: No    Isaiah Blazer, MA, LCSW, [facility], LCAS 03/09/2022

## 2022-03-12 ENCOUNTER — Other Ambulatory Visit (HOSPITAL_COMMUNITY): Payer: 59 | Attending: Medical | Admitting: Licensed Clinical Social Worker

## 2022-03-12 DIAGNOSIS — F1590 Other stimulant use, unspecified, uncomplicated: Secondary | ICD-10-CM | POA: Diagnosis not present

## 2022-03-12 DIAGNOSIS — F12229 Cannabis dependence with intoxication, unspecified: Secondary | ICD-10-CM | POA: Diagnosis not present

## 2022-03-12 DIAGNOSIS — F1499 Cocaine use, unspecified with unspecified cocaine-induced disorder: Secondary | ICD-10-CM | POA: Diagnosis present

## 2022-03-12 DIAGNOSIS — F1090 Alcohol use, unspecified, uncomplicated: Secondary | ICD-10-CM | POA: Diagnosis not present

## 2022-03-12 DIAGNOSIS — F1220 Cannabis dependence, uncomplicated: Secondary | ICD-10-CM

## 2022-03-12 DIAGNOSIS — F1320 Sedative, hypnotic or anxiolytic dependence, uncomplicated: Secondary | ICD-10-CM

## 2022-03-12 DIAGNOSIS — F1420 Cocaine dependence, uncomplicated: Secondary | ICD-10-CM

## 2022-03-12 NOTE — Progress Notes (Signed)
Daily Group Progress Note   Program: CD IOP     Group Time: 9 a.m. to 12 p.m.   Type of Therapy: Process and Psychoeducational    Topic: The therapist checks in with group members, assesses for SI/HI/psychosis and overall level of functioning. The therapist inquires about sobriety date and number of community support meetings attended since last session.    The therapist has clients use the emotion wheel to describe how they are feeling today using two emotions. As two of the group members have still not attended an [place] meeting, the therapist has the group attend most of a virtual meeting which is a [place] study from focusing on Step 11.  The therapist shares a NA Just For Today reading on the importance of maintaining emotional balance which he connects to Matrix Module RP 11 Anticipating and Preventing Relapse. He suggests that starting the day with a morning meditation or routine involving meditation  and an AA or NA reading can help set the tone for maintaining emotional balance throughout the day. The therapist explains what contemplative medication or prayer is. The therapist also emphasizes the importance of people sharing their emotions with other trusted persons in their lives versus holding them in.  The therapist completes RP 59 and begins RP 12 on Trust showing a video focusing on addicts' ability to trust themselves and another video focusing on how the family members of addicts can grow to not trust them and how addicts can also not trust family members perceiving their efforts to get them to not use drugs or alcohol as simply focused on being controlling. The therapist also suggests that many addicts do not trust other people at all based on negative, early life experiences or trauma and that people can also not have a trust in their [place] should they believe in one.    Summary: Isaiah Arnold presents for group rating his depression as a "0" and anxiety as a "0" with no SI or HI.  He  has not attended any meetings over the weekend. He describes his mood today as being "confident and happy." He says that he was going to be quiet in group today; however, after reading something in one of the books in the group room, he decided to "start talking" instead which he says "switched" his mood to the positive almost immediately. He discloses that not using drugs has been "surprisingly" easy. The therapist suggests that perhaps the reason that Isaiah Arnold has found it so easy to not use is that he has been so open about sharing his emotions, even the uncomfortable ones, in group.  He rates is trust in people in general as being a "7" on a scale of 1-10 with being being complete trust. After watching one of the videos today, he says that he liked the presenter's idea of believing that he would never use again rather than the Twelve Step approach of making the goal, "just for today." Isaiah Arnold says that he also wants to "prove" that he can recover without it being with the Twelve Step program in spite of the fact that he continues to attend meetings and likes his Tuesday meeting.  The therapist notes that research indicates that recovery takes place in fellowship; however, this does not necessarily mean AA or NA. At the same time, the therapist makes the observation that AA and NA has sobers supports already in place such that a person is not left with having to do a labor intensive search  for sober supports.   In talking about honesty, Isaiah Arnold admits that he is still talking with his ex-girlfriend who lives in [place] but now only as a friend. He admits that it could be a "slippery slope" but believes that this contact actually improves how he is in relating to his wife. At the same time, he recognizes that his wife would be upset if she were to find out that he was conversing with the ex.   Progress Towards Goals: Isaiah Arnold denies any drug use.    UDS collected: Yes  Results: No   AA/NA attended?: Yes, in  group  Sponsor?: No    Myrna Blazer, [place], LCSW, [facility], LCAS 03/12/2022

## 2022-03-14 ENCOUNTER — Other Ambulatory Visit (HOSPITAL_COMMUNITY): Payer: 59 | Attending: Medical | Admitting: Licensed Clinical Social Worker

## 2022-03-14 DIAGNOSIS — F1499 Cocaine use, unspecified with unspecified cocaine-induced disorder: Secondary | ICD-10-CM | POA: Diagnosis not present

## 2022-03-14 DIAGNOSIS — F1090 Alcohol use, unspecified, uncomplicated: Secondary | ICD-10-CM | POA: Insufficient documentation

## 2022-03-14 DIAGNOSIS — F1590 Other stimulant use, unspecified, uncomplicated: Secondary | ICD-10-CM | POA: Insufficient documentation

## 2022-03-14 DIAGNOSIS — F1420 Cocaine dependence, uncomplicated: Secondary | ICD-10-CM

## 2022-03-14 DIAGNOSIS — F1220 Cannabis dependence, uncomplicated: Secondary | ICD-10-CM

## 2022-03-14 DIAGNOSIS — F1320 Sedative, hypnotic or anxiolytic dependence, uncomplicated: Secondary | ICD-10-CM

## 2022-03-14 DIAGNOSIS — F1429 Cocaine dependence with unspecified cocaine-induced disorder: Secondary | ICD-10-CM | POA: Diagnosis present

## 2022-03-14 NOTE — Progress Notes (Signed)
Daily Group Progress Note   Program: CD IOP     Group Time: 9 a.m. to 12 p.m.   Type of Therapy: Process and Psychoeducational    Topic: The therapist checks in with group members, assesses for SI/HI/psychosis and overall level of functioning. The therapist inquires about sobriety date and number of community support meetings attended since last session.   The therapist discusses the story of Rush Landmark W's spiritual awakening and the pitfalls associated with trying to recapture a "spiritual awakening" that was the result of a drug using experience. The therapist agrees with another group member that pursuing something like meditation while sober would be a preferable path. He explains how yoga is a form of meditation.  The therapist completes Matrix Model RP 12 on Trust showing videos on betrayal trauma and another video discussing the impact of negative childhood experiences on a person's ability to trust later in life. The therapist notes that in order for a person to recovery from betrayal trauma that it involves taking the risk of engaging in a relationship as a healthy relationship is a requirement for healing. The therapist points out how AA and NA can facilitate this and educates the group about the ACES study and how adverse childhood experiences can impact people's mental and physical health later in life.    Summary: Isaiah Arnold presents for group rating his depression as a "0" and anxiety as a "0" with no SI or HI.  He describes him mood as being "energetic and playful." The therapist makes the observation that this is the first time that Isaiah Arnold's mood has remained good for two meetings in a row as it has been quite labile since he first started group.  Isaiah Arnold shares that he has almost every early childhood adverse experience but one noting that the information presented makes him realize that he is actually doing quite well in spite of this.  He talks about his difficulty trusting middle-aged men.  He says that his wife has provided him with relational treasures and how he is trying to assure that his children's livers are full of the relational treasures which he did not have growing up.   Progress Towards Goals: Isaiah Arnold denies any drug use.    UDS collected: No  Results: No   AA/NA attended?: No  Sponsor?: No    Isaiah Arnold, [place], LCSW, [facility], LCAS 03/14/2022

## 2022-03-15 ENCOUNTER — Other Ambulatory Visit (HOSPITAL_COMMUNITY): Payer: Self-pay | Admitting: Medical

## 2022-03-16 ENCOUNTER — Other Ambulatory Visit (HOSPITAL_COMMUNITY): Payer: 59 | Attending: Medical | Admitting: Licensed Clinical Social Worker

## 2022-03-16 DIAGNOSIS — F1590 Other stimulant use, unspecified, uncomplicated: Secondary | ICD-10-CM

## 2022-03-16 DIAGNOSIS — F1220 Cannabis dependence, uncomplicated: Secondary | ICD-10-CM | POA: Diagnosis not present

## 2022-03-16 DIAGNOSIS — Z811 Family history of alcohol abuse and dependence: Secondary | ICD-10-CM | POA: Diagnosis not present

## 2022-03-16 DIAGNOSIS — F1320 Sedative, hypnotic or anxiolytic dependence, uncomplicated: Secondary | ICD-10-CM | POA: Insufficient documentation

## 2022-03-16 DIAGNOSIS — Z62811 Personal history of psychological abuse in childhood: Secondary | ICD-10-CM | POA: Diagnosis not present

## 2022-03-16 DIAGNOSIS — Z818 Family history of other mental and behavioral disorders: Secondary | ICD-10-CM | POA: Insufficient documentation

## 2022-03-16 DIAGNOSIS — F4310 Post-traumatic stress disorder, unspecified: Secondary | ICD-10-CM | POA: Insufficient documentation

## 2022-03-16 DIAGNOSIS — F1420 Cocaine dependence, uncomplicated: Secondary | ICD-10-CM | POA: Insufficient documentation

## 2022-03-16 NOTE — Progress Notes (Signed)
Daily Group Progress Note   Program: CD IOP     Group Time: 9 a.m. to 12 p.m.   Type of Therapy: Process and Psychoeducational    Topic: The therapist checks in with group members, assesses for SI/HI/psychosis and overall level of functioning. The therapist inquires about sobriety date and number of community support meetings attended since last session.   The therapist has group members introduce themselves and talk about how they came to be in IOP as there are two number members in group today.  The therapist facilitates discussions on how to find and Sponsor, how grief typically leads to irrational guilt, and on the fact that even drugs without severe withdrawal can be addiction based on changes to the brain and neurotransmitters in the body which lead to cravings to use. The therapist answers group member's questions about various drugs.  The therapist reads the Just for Today NA reading on having gratitude for one's recovery viewing it as a gift which needed to be care for. The therapist notes that most addicts never receive treatment and that even some with treatment end up dying from the disease.    Summary: Earvin presents for group rating his depression as a "0" and anxiety as a "0" with no SI or HI.  He has still not attended any meetings since last group. He describes his mood as being "proud" and "optimistic." The therapist observes that this is the 3rd group in a row in which his mood has remained good.  He says that this is the most clean time that he has had since he started using at age 36. He talks about having had the belief that he was more creative when using noting that his creativity is coming back. 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  At the conclusion of the group, he notes that it was good to see "new faces."   Progress Towards Goals: Tyri denies any drug use.    UDS collected: No  Results:  No   AA/NA attended?: No  Sponsor?: No    Shaheem Phenix, [place], LCSW, [facility], LCAS 03/16/2022

## 2022-03-19 ENCOUNTER — Other Ambulatory Visit (HOSPITAL_COMMUNITY): Payer: 59 | Attending: Psychiatry | Admitting: Licensed Clinical Social Worker

## 2022-03-19 DIAGNOSIS — F1320 Sedative, hypnotic or anxiolytic dependence, uncomplicated: Secondary | ICD-10-CM

## 2022-03-19 DIAGNOSIS — F1911 Other psychoactive substance abuse, in remission: Secondary | ICD-10-CM | POA: Insufficient documentation

## 2022-03-19 DIAGNOSIS — F1220 Cannabis dependence, uncomplicated: Secondary | ICD-10-CM

## 2022-03-19 DIAGNOSIS — F1590 Other stimulant use, unspecified, uncomplicated: Secondary | ICD-10-CM

## 2022-03-19 DIAGNOSIS — F1420 Cocaine dependence, uncomplicated: Secondary | ICD-10-CM

## 2022-03-19 NOTE — Progress Notes (Signed)
Daily Group Progress Note   Program: CD IOP     Group Time: 9 a.m. to 12 p.m.   Type of Therapy: Process and Psychoeducational    Topic: The therapist checks in with group members, assesses for SI/HI/psychosis and overall level of functioning. The therapist inquires about sobriety date and number of community support meetings attended since last session.    The therapist facilitates discussions concerning people revealing to people outside the program that they are an addict or an alcoholic. The therapist also emphasizes that not just people who have been wronged but those who have done wrong need support as well. He discusses strategies for smoking cessation and reminds group members about the [employer] information posted on the doors to the IOP group room.  He shows a video which illustrates that addiction is not a character defect covering how drugs and alcohol impact the brain and reduce D2 Dopamine receptors. The therapist also explains about addiction's impact on people's prefrontal cortex introducing this information to explain the need for people in recovery to focus on being smart and not strong with RP 13 Be Smart Not Strong being the module which the group is currently about to start.  The therapist informs group that marijuana is a drug and that the fact that it grows naturally in fields does not in any way make it not a drug. The therapist explains that heroin is made from poppies that grow in fields, that alcohol be made from barley and hops which grow in fields, etcetera. The therapist also notes that the extremely high THC levels currently found in pot plants is the result of human intervention aimed at increasing these levels.    Summary: Isaiah Arnold presents for group rating his depression as a "0" and anxiety as a "4" with no SI or HI.  He describe his mood as being "worried and guilty." He says that his wife went through his phone and found his text messages to the ex-girlfriend.  His wife decided to go through Isaiah Arnold's phone as he was actually strangely the past few days.  He says that his wife talked of wanting to leave him this morning. Isaiah Arnold gets a lot of support and feedback from the group regarding how to handle the situation with the therapist again offering to facilitate a session with Isaiah Arnold and his wife.   The therapist asks Isaiah Arnold who he has to talk to to provide him support as he goes through this with Isaiah Arnold responding, "No one." The therapist reminds Isaiah Arnold that he can seek support from group members in the [place] and reminds him of the support available at Twelve Step meetings.  The therapist asks Isaiah Arnold if he has given up on meeting attendance. He says that he has not given up on meetings saying that he typically attends one or two meetings per week but attended none last week.   Progress Towards Goals: Isaiah Arnold denies any drug use.    UDS collected: Yes  Results: Yes, negative for drugs and alcohol   AA/NA attended?: No  Sponsor?: No    Isaiah Arnold, [place], LCSW, [facility], LCAS 03/19/2022

## 2022-03-21 ENCOUNTER — Encounter (HOSPITAL_COMMUNITY): Payer: Self-pay | Admitting: Licensed Clinical Social Worker

## 2022-03-21 ENCOUNTER — Other Ambulatory Visit (HOSPITAL_COMMUNITY): Payer: 59 | Attending: Medical | Admitting: Licensed Clinical Social Worker

## 2022-03-21 DIAGNOSIS — F1510 Other stimulant abuse, uncomplicated: Secondary | ICD-10-CM | POA: Insufficient documentation

## 2022-03-21 DIAGNOSIS — Z811 Family history of alcohol abuse and dependence: Secondary | ICD-10-CM

## 2022-03-21 DIAGNOSIS — F1420 Cocaine dependence, uncomplicated: Secondary | ICD-10-CM | POA: Diagnosis present

## 2022-03-21 DIAGNOSIS — Z818 Family history of other mental and behavioral disorders: Secondary | ICD-10-CM

## 2022-03-21 DIAGNOSIS — F1320 Sedative, hypnotic or anxiolytic dependence, uncomplicated: Secondary | ICD-10-CM | POA: Insufficient documentation

## 2022-03-21 DIAGNOSIS — F1220 Cannabis dependence, uncomplicated: Secondary | ICD-10-CM | POA: Diagnosis not present

## 2022-03-21 DIAGNOSIS — Z62811 Personal history of psychological abuse in childhood: Secondary | ICD-10-CM | POA: Insufficient documentation

## 2022-03-21 DIAGNOSIS — F1120 Opioid dependence, uncomplicated: Secondary | ICD-10-CM

## 2022-03-21 DIAGNOSIS — F4312 Post-traumatic stress disorder, chronic: Secondary | ICD-10-CM | POA: Insufficient documentation

## 2022-03-21 DIAGNOSIS — F1590 Other stimulant use, unspecified, uncomplicated: Secondary | ICD-10-CM

## 2022-03-21 DIAGNOSIS — Z6372 Alcoholism and drug addiction in family: Secondary | ICD-10-CM | POA: Insufficient documentation

## 2022-03-21 DIAGNOSIS — T7432XS Child psychological abuse, confirmed, sequela: Secondary | ICD-10-CM

## 2022-03-21 DIAGNOSIS — F1920 Other psychoactive substance dependence, uncomplicated: Secondary | ICD-10-CM

## 2022-03-21 NOTE — Progress Notes (Signed)
Daily Group Progress Note   Program: CD IOP     Group Time: 9:15 a.m. to 12 p.m.   Type of Therapy: Process and Psychoeducational    Topic: The therapist checks in with group members, assesses for SI/HI/psychosis and overall level of functioning. The therapist inquires about sobriety date and number of community support meetings attended since last session.    The therapist completes Matrix Module RP 13 Be Smart, Not Strong having group members complete the questionnaire in the module and discuss their Recovery IQ scores and how they can work to increase them.    Summary: Isaiah Arnold presents for group rating his depression as a "0" and anxiety as a "3" with no SI or HI.  Isaiah Arnold describes his mood as "powerless, frustrated" and "disrespected." He has been to no meetings and arrives late for group blaming his dog who is the reason that he feels disrespected. Isaiah Arnold's apparently aging dog urinated and defecated in his children's room. Isaiah Arnold says that the dog has done this before so he keeps the door closed; however, he inadvertently must have left it open. The therapist explains that geriatric animals can have similar issues to geriatric people and recommends that Isaiah Arnold clean up such accidents with an enzyme for pet urine or the dog will return to the same spot. He encourages Isaiah Arnold to not anthropomorphize his dog's behavior.  Isaiah Arnold says that his wife remains upset and has suggested couple's therapy to work on separating but does not want to see this therapist. He says that chances of her ending the marriage are 50/50. He admits that his wife told him to not contact his ex and he did not agree with this so contacted her and lied to his wife on multiple occasions when she asked if his ex reached out. He reiterates that he would prefer to be with the ex if not for his children.  The therapist solicits feedback from other group members and the therapist discusses the importance of honesty in recovery and how the 4th  Step focuses on removing character defects. The therapist makes the observation that Isaiah Arnold cannot decide between his marriage and his ex; however, if he continues to remain on the fence that his wife will make the decision for him.  Isaiah Arnold admits that he does not feel like going to a meeting with the therapist noting that if most people only went to meetings when they felt like it that they may never go and that he may feel better after making himself do so.  His recovery IQ score is a 32. He says that he could increase it by being truthful, scheduling, exercising, and getting to meetings. He says that today's group helped to reinforce why he is in group.  Progress Towards Goals: Isaiah Arnold denies any drug use.    UDS collected: No Results: No   AA/NA attended?: No  Sponsor?: No    Sotero Phenix, [place], LCSW, [facility], LCAS 03/21/2022
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
## 2022-03-22 ENCOUNTER — Other Ambulatory Visit (HOSPITAL_COMMUNITY): Payer: Self-pay | Admitting: Medical

## 2022-03-22 NOTE — Telephone Encounter (Signed)
In treatment CD IOP now

## 2022-03-22 NOTE — Telephone Encounter (Signed)
Pt in [place] refills thru November

## 2022-03-23 ENCOUNTER — Other Ambulatory Visit (HOSPITAL_COMMUNITY): Payer: 59 | Attending: Psychiatry | Admitting: Licensed Clinical Social Worker

## 2022-03-23 DIAGNOSIS — F419 Anxiety disorder, unspecified: Secondary | ICD-10-CM | POA: Diagnosis not present

## 2022-03-23 DIAGNOSIS — F1220 Cannabis dependence, uncomplicated: Secondary | ICD-10-CM

## 2022-03-23 DIAGNOSIS — F1320 Sedative, hypnotic or anxiolytic dependence, uncomplicated: Secondary | ICD-10-CM

## 2022-03-23 DIAGNOSIS — F32A Depression, unspecified: Secondary | ICD-10-CM | POA: Diagnosis present

## 2022-03-23 DIAGNOSIS — F1590 Other stimulant use, unspecified, uncomplicated: Secondary | ICD-10-CM

## 2022-03-23 DIAGNOSIS — F1420 Cocaine dependence, uncomplicated: Secondary | ICD-10-CM

## 2022-03-23 NOTE — Progress Notes (Signed)
Daily Group Progress Note   Program: CD IOP     Group Time: 9 a.m. to 12 p.m.   Type of Therapy: Process and Psychoeducational    Topic: The therapist checks in with group members, assesses for SI/HI/psychosis and overall level of functioning. The therapist inquires about sobriety date and number of community support meetings attended since last session.   The therapist discusses the importance of assertiveness in relation to self-care which is critical to recovery. The therapist presents Matrix Module RP 14 Defining Spirituality. As group member struggle to come up with specific versus subjective answers to the questions in the module, the therapist provides them the questions as homework to work on over the weekend. He has them add whether or not they have a spiritual practice that gives them hope and meaning and purpose and if so, how often do they do this practice.   The therapist shows a video on Three Ways Spirituality Supports Addiction Recovery.    Summary: Isaiah Arnold presents for group rating his depression as a "0" and anxiety as a "2" with no SI or HI.  Isaiah Arnold describes his mood as "overwhelmed and worried." He says that his wife is having her CT Scan today to determine the severity of her cancer and that she has not been feeling well this week. He says that he is "broke" and has debt "piling up" and that his wife has to take his kids to all their activities due to his work schedule. He says that his son is in the Wizard of [employer] showing past pictures of his son from when he was in this production before which seems to brighten Isaiah Arnold's mood.   Like most group members, Isaiah Arnold defines spirituality mainly as a person's values, rules, activities, and views. He defines success as having more money so as to have more options open to him and success as being able to do things to make others feel good. The therapist notes that perhaps one day that Isaiah Arnold will Sponsor others which is congruent with the values  he describes.   Progress Towards Goals: Isaiah Arnold denies any drug use.    UDS collected: No Results: No   AA/NA attended?: No  Sponsor?: No    Isaiah Arnold, [place], LCSW, [facility], LCAS 03/23/2022

## 2022-03-26 ENCOUNTER — Other Ambulatory Visit (HOSPITAL_COMMUNITY): Payer: 59 | Attending: Psychiatry | Admitting: Licensed Clinical Social Worker

## 2022-03-26 DIAGNOSIS — Z79899 Other long term (current) drug therapy: Secondary | ICD-10-CM | POA: Insufficient documentation

## 2022-03-26 DIAGNOSIS — F1520 Other stimulant dependence, uncomplicated: Secondary | ICD-10-CM | POA: Insufficient documentation

## 2022-03-26 DIAGNOSIS — F1220 Cannabis dependence, uncomplicated: Secondary | ICD-10-CM | POA: Insufficient documentation

## 2022-03-26 DIAGNOSIS — F1320 Sedative, hypnotic or anxiolytic dependence, uncomplicated: Secondary | ICD-10-CM | POA: Diagnosis not present

## 2022-03-26 DIAGNOSIS — F1590 Other stimulant use, unspecified, uncomplicated: Secondary | ICD-10-CM

## 2022-03-26 DIAGNOSIS — F1420 Cocaine dependence, uncomplicated: Secondary | ICD-10-CM | POA: Diagnosis present

## 2022-03-26 NOTE — Progress Notes (Signed)
Daily Group Progress Note   Program: CD IOP     Group Time: 9 a.m. to 12 p.m.   Type of Therapy: Process and Psychoeducational    Topic: The therapist checks in with group members, assesses for SI/HI/psychosis and overall level of functioning. The therapist inquires about sobriety date and number of community support meetings attended since last session.   The therapist attempts to review homework from last group; however, as two of the three persons in group did not do the homework; the therapist reassigns it to cover in group on 03/28/22.  The therapist focuses primarily on the need for people in recovery to not isolate and to build sober, non-using supports while at the same time discussing how doing so can be extremely challenging for people new to recovery as they may have issues with trusting people with many people with addiction having early life trauma.  The therapist shows a video, "Everything you think you know about addiction is wrong" with Bertell Maria emphasizing the importance of social support in recovery.  The therapist suggests that in addiction to Twelve Step meetings that people in recovery can find sober supports by joining hiking groups, book clubs, Engineering geologist.   Summary: Reno presents for group rating his depression as a "0" and anxiety as a "0" with no SI or HI.  Danney describes his mood as "sleepy and lonely." He says that his weekend was not great and that he is feeling down. He admits that he did not do his homework from group on Friday.  He complains of having no friends and no support while admitting that he is "resentful" about the "whole sober thing." He admits that he has not fully taken the first step still believing that he can return to using and manage it. He says that he has thought that if he only had a prescription for Adderall that things would be o.k. saying that the Adderall shortage and getting connected with one person in particular is what triggered  much of his other substance use. He says that he has food friends he cannot see as they are connected to drugs in some way or another and his wife does not trust them. He says that he wanted to use this past weekend and told his wife that he is not sure how long his sobriety is going to last.   In spite of feeling lonely, Jaquille refuses to go to meetings. The therapist observes that just as Rhen is on the fence in choosing his wife versus his ex that he is on the fence in choosing recovery versus his life of using. Wyeth's resentment towards being in recovery makes it hard if not possible to meet sober supports at meetings as does his anxiety in meeting people while not under the influence.  The therapist notes that Gordon was at [facility] 10 years ago so this is not the first time that his use has led to problems causing him to enter treatment. The therapist encourages Shreyas to go to meetings and to share about how he is feeling and about the fact that it is hard for him to meet people sober.The therapist states his opinion that people with addiction who avoid using friends but replace it with isolation are very prone to relapse.  Kelcey complains that his Wellbutrin is not working and voices a lack of confidence that the P.A. is knowledgeable in prescribing psychotropic medication. The therapist points out the fact that Azeem chose to not  take the Trintillex which he says is because an anti-depressant that he took in the past prevented him from feeling anything.   As Averill expresses a willingness to get the genetic testing to see what medication will work for him, the therapist talks to the P.A. about this who writes the order such that the Nurse gets a cheek swab from [place] today.  During group, Cathy gets a message from his wife that she will not need chemotherapy as her lymphnodes are free of cancer.    Progress Towards Goals: Raheel denies any drug use.    UDS collected: Yes Results: Yes; negative for  drugs and alcohol   AA/NA attended?: No  Sponsor?: No    Donavin Phenix, [place], LCSW, [facility], LCAS 03/26/2022

## 2022-03-27 ENCOUNTER — Telehealth (HOSPITAL_COMMUNITY): Payer: Self-pay | Admitting: *Deleted

## 2022-03-27 NOTE — Telephone Encounter (Signed)
GeneSight sample taken. Consent signed by pt. [employer] pickup scheduled for this afternoon.

## 2022-03-28 ENCOUNTER — Other Ambulatory Visit (HOSPITAL_COMMUNITY): Payer: 59 | Attending: Psychiatry | Admitting: Licensed Clinical Social Worker

## 2022-03-28 DIAGNOSIS — F1920 Other psychoactive substance dependence, uncomplicated: Secondary | ICD-10-CM | POA: Diagnosis not present

## 2022-03-28 DIAGNOSIS — F4312 Post-traumatic stress disorder, chronic: Secondary | ICD-10-CM | POA: Diagnosis not present

## 2022-03-28 DIAGNOSIS — F1320 Sedative, hypnotic or anxiolytic dependence, uncomplicated: Secondary | ICD-10-CM | POA: Insufficient documentation

## 2022-03-28 DIAGNOSIS — Z811 Family history of alcohol abuse and dependence: Secondary | ICD-10-CM | POA: Diagnosis not present

## 2022-03-28 DIAGNOSIS — F1590 Other stimulant use, unspecified, uncomplicated: Secondary | ICD-10-CM | POA: Insufficient documentation

## 2022-03-28 DIAGNOSIS — F1220 Cannabis dependence, uncomplicated: Secondary | ICD-10-CM | POA: Diagnosis not present

## 2022-03-28 DIAGNOSIS — F1021 Alcohol dependence, in remission: Secondary | ICD-10-CM | POA: Insufficient documentation

## 2022-03-28 DIAGNOSIS — F1120 Opioid dependence, uncomplicated: Secondary | ICD-10-CM | POA: Diagnosis not present

## 2022-03-28 DIAGNOSIS — Z818 Family history of other mental and behavioral disorders: Secondary | ICD-10-CM | POA: Insufficient documentation

## 2022-03-28 DIAGNOSIS — Z62819 Personal history of unspecified abuse in childhood: Secondary | ICD-10-CM | POA: Insufficient documentation

## 2022-03-28 DIAGNOSIS — F1420 Cocaine dependence, uncomplicated: Secondary | ICD-10-CM | POA: Diagnosis not present

## 2022-03-28 NOTE — Progress Notes (Signed)
Daily Group Progress Note   Program: CD IOP     Group Time: 9 a.m. to 12 p.m.   Type of Therapy: Process and Psychoeducational    Topic: The therapist checks in with group members, assesses for SI/HI/psychosis and overall level of functioning. The therapist inquires about sobriety date and number of community support meetings attended since last session.   The therapist has group members complete and discuss the questions in RP 14 Defining Spirituality as the major of group members did not complete this homework.    Summary: Isaiah Arnold presents for group rating his depression as a "0" and anxiety as a "0" with no SI or HI.  Isaiah Arnold describes his mood as "hopeful" and "tired.' When another group member talks about his experience of having been exhausted recently and needing to sleep, Isaiah Arnold says that when he feels completely overwhelmed that he remembers the acronym, "HALT" saying that he is usually experiencing all of them.  He attended a meeting on Monday and picked up his 60 day key tag saying that people congratulated him; however, he continues to struggle with social anxiety. Other group members admit to having issues with social anxiety at meetings as well providing consensual validation. When Isaiah Arnold talks about needing a "wingman," the therapist suggests that the other group member with similar issues could possibly attend a meeting with Isaiah Arnold with Isaiah Arnold saying that he had been meaning to get this other group member's telephone number before this. They exchange numbers and the other group member agrees to attend a meeting with Isaiah Arnold says that his wife has asked about a family session with Isaiah Arnold and this therapist which the therapist schedules for next week. The therapist notes that Isaiah Arnold could text his ex and break off contact showing his wife this; however, Isaiah Arnold admits to still not being fully ready to do this. The therapist notes that not doing so is incongruent with his goal of nto "self-sabotaging"  his marriage and being a "man of" his "word."   Progress Towards Goals: Isaiah Arnold denies any drug use.    UDS collected: No Results: No   AA/NA attended?: Yes  Sponsor?: No    Isaiah Arnold, [place], LCSW, [facility], LCAS 03/28/2022

## 2022-03-30 ENCOUNTER — Other Ambulatory Visit (HOSPITAL_COMMUNITY): Payer: 59 | Attending: Psychiatry | Admitting: Licensed Clinical Social Worker

## 2022-03-30 DIAGNOSIS — F1590 Other stimulant use, unspecified, uncomplicated: Secondary | ICD-10-CM

## 2022-03-30 DIAGNOSIS — F1021 Alcohol dependence, in remission: Secondary | ICD-10-CM | POA: Diagnosis present

## 2022-03-30 DIAGNOSIS — F1320 Sedative, hypnotic or anxiolytic dependence, uncomplicated: Secondary | ICD-10-CM

## 2022-03-30 DIAGNOSIS — F1220 Cannabis dependence, uncomplicated: Secondary | ICD-10-CM

## 2022-03-30 DIAGNOSIS — F1420 Cocaine dependence, uncomplicated: Secondary | ICD-10-CM

## 2022-03-30 NOTE — Progress Notes (Signed)
Daily Group Progress Note   Program: CD IOP     Group Time: 9 a.m. to 12 p.m.   Type of Therapy: Process and Psychoeducational    Topic: The therapist checks in with group members, assesses for SI/HI/psychosis and overall level of functioning. The therapist inquires about sobriety date and number of community support meetings attended since last session.   The therapist discusses the cross tolerance between benzodiazepines and alcohol and the reason that benzodiazepines are contraindicated for persons with a history of an alcohol use disorder. The therapist also discusses the limited uses for opioid pain medication such as for very short-term use for post surgical pain.  The therapist has group members complete and discuss a Electronics engineer for Establishing a Support System."    Summary: Isaiah Arnold presents for group rating his depression as a "0" and anxiety as a "0" with no SI or HI.  Isaiah Arnold describes his mood as "content" and "loving." He has not attended any more meetings and notes that he feels the way he does as he had a good week with his wife.  The therapist asks where things are with the ex-girlfriend. He says that he messaged her to let her know that they need to have no more contact as it is negatively impacting his marriage; however, she has contacted him twice since one time congratulating him on his 53 days.  He admits that he should block her but has not with the therapist and other groups members encouraging him to do so if he is committed to his marriage.The therapists asks where Isaiah Arnold is in Healing the Shame that [place] and he says that he got off track with reading it but needs to resume doing so.  The group runs out of time before Isaiah Arnold can share his answers from the worksheet.   Progress Towards Goals: Isaiah Arnold denies any drug use.    UDS collected: No Results: Yes, negative for drugs and alcohol   AA/NA attended?: No  Sponsor?: No    Robb Phenix, [place], LCSW, [facility],  LCAS 03/30/2022

## 2022-04-02 ENCOUNTER — Encounter (HOSPITAL_COMMUNITY): Payer: 59

## 2022-04-04 ENCOUNTER — Encounter (HOSPITAL_COMMUNITY): Payer: Self-pay | Admitting: Licensed Clinical Social Worker

## 2022-04-04 ENCOUNTER — Other Ambulatory Visit (HOSPITAL_COMMUNITY): Payer: 59 | Attending: Medical | Admitting: Licensed Clinical Social Worker

## 2022-04-04 DIAGNOSIS — F1120 Opioid dependence, uncomplicated: Secondary | ICD-10-CM

## 2022-04-04 DIAGNOSIS — F1590 Other stimulant use, unspecified, uncomplicated: Secondary | ICD-10-CM

## 2022-04-04 DIAGNOSIS — T7432XS Child psychological abuse, confirmed, sequela: Secondary | ICD-10-CM

## 2022-04-04 DIAGNOSIS — F32A Depression, unspecified: Secondary | ICD-10-CM | POA: Diagnosis present

## 2022-04-04 DIAGNOSIS — Z6372 Alcoholism and drug addiction in family: Secondary | ICD-10-CM | POA: Diagnosis not present

## 2022-04-04 DIAGNOSIS — F1320 Sedative, hypnotic or anxiolytic dependence, uncomplicated: Secondary | ICD-10-CM | POA: Diagnosis not present

## 2022-04-04 DIAGNOSIS — Z818 Family history of other mental and behavioral disorders: Secondary | ICD-10-CM

## 2022-04-04 DIAGNOSIS — F1021 Alcohol dependence, in remission: Secondary | ICD-10-CM | POA: Insufficient documentation

## 2022-04-04 DIAGNOSIS — F1220 Cannabis dependence, uncomplicated: Secondary | ICD-10-CM | POA: Insufficient documentation

## 2022-04-04 DIAGNOSIS — F1420 Cocaine dependence, uncomplicated: Secondary | ICD-10-CM | POA: Diagnosis not present

## 2022-04-04 DIAGNOSIS — Z811 Family history of alcohol abuse and dependence: Secondary | ICD-10-CM

## 2022-04-04 DIAGNOSIS — F4312 Post-traumatic stress disorder, chronic: Secondary | ICD-10-CM | POA: Diagnosis not present

## 2022-04-04 DIAGNOSIS — F1920 Other psychoactive substance dependence, uncomplicated: Secondary | ICD-10-CM

## 2022-04-04 DIAGNOSIS — F1510 Other stimulant abuse, uncomplicated: Secondary | ICD-10-CM | POA: Diagnosis not present

## 2022-04-04 MED ORDER — VORTIOXETINE HBR 10 MG PO TABS: ORAL_TABLET | ORAL | 0 refills | Status: DC

## 2022-04-04 NOTE — Progress Notes (Signed)
Daily Group Progress Note   Program: CD IOP     Group Time: 9 a.m. to 11:30 a.m.   Type of Therapy: Process and Psychoeducational    Topic: The therapist checks in with group members, assesses for SI/HI/psychosis and overall level of functioning. The therapist inquires about sobriety date and number of community support meetings attended since last session.   The therapist shows a video on five tips for staying sober which include daily meeting attendance, talking to someone in recovery daily, praying and meditating daily, reading four pages of spiritual material, and doing a daily gratitude list. The therapist facilitates a discussion on the group's impression of these suggestions and whether they are doing any. He assigns the homework of group members doing the prayer/meditation, spiritual reading, and gratitude list daily for one week.  He discusses the concept of radical acceptance and how this applies to recovery.    Summary: Trashaun presents for group rating his depression as a "0" and anxiety as a "0" with no SI or HI.  Oron describes his mood as "thankful" and "playful." He has attended no meetings. He says that he is thankful that people at work can "take care of stuff" such that he can make this group. He says that he is "playful" as he has been hanging out with his chefs. He says that he went to the park with his kids and did "fun dad" stuff.   He shares his homework from the group before last noting that he has used with all the people in his support system but his wife. He talks about planning to message two friends both of whom apparently have some substance use issues. He also says that he is not taking his Wellbutrin as of 03/30/22 concluding that it is not working and that he does not need it.  In discussing how people in recovery can get support and validation from others in recovery who are able to understand things that non-recovering people cannot get, Kshawn states that he does  not care what people think. Thus, the therapist questions if Glenn does not care what people think what the reason is that he is so anxious regarding interacting with people at meetings. Ermias says that he does not like being the center of attention and speculates that he perhaps does not want to interact with people like him with the therapist noting that he is interacting with people like him in group.  The therapist informs Bobbye that a person stopping his or her anti-depressant will not immediately become depressed; however, it is possible for the depression come back days or weeks after going off the medication. The therapists asks if Eron has blocked contact with the ex with Kebin saying that the door is "seventy-five percent shut."   The therapist makes the observation that Kellie lacks much in the way of sober supports and emphasizes what research states about recovery taking place in fellowship. He also notes that the Twelve Steps are geared towards helping people change their primary relationship from their drug of choice to relying on people instead with Ami admitting that he "may" have issues with commitment.   Elijahjames has to leave group abruptly as his wife calls regarding having some sort of reaction to a medical procedure saying that it is an emergency.   Progress Towards Goals: Eriverto denies any drug use.    UDS collected: Yes Results: No   AA/NA attended?: No  Sponsor?: No    Cohan Phenix, MA,  LCSW, [facility], LCAS 04/04/2022
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
## 2022-04-05 ENCOUNTER — Ambulatory Visit (HOSPITAL_COMMUNITY): Payer: 59 | Admitting: Licensed Clinical Social Worker

## 2022-04-06 ENCOUNTER — Other Ambulatory Visit (HOSPITAL_COMMUNITY): Payer: 59 | Attending: Psychiatry | Admitting: Licensed Clinical Social Worker

## 2022-04-06 DIAGNOSIS — F1220 Cannabis dependence, uncomplicated: Secondary | ICD-10-CM | POA: Diagnosis not present

## 2022-04-06 DIAGNOSIS — F1320 Sedative, hypnotic or anxiolytic dependence, uncomplicated: Secondary | ICD-10-CM

## 2022-04-06 DIAGNOSIS — F1021 Alcohol dependence, in remission: Secondary | ICD-10-CM | POA: Diagnosis not present

## 2022-04-06 DIAGNOSIS — F1420 Cocaine dependence, uncomplicated: Secondary | ICD-10-CM

## 2022-04-06 DIAGNOSIS — F1590 Other stimulant use, unspecified, uncomplicated: Secondary | ICD-10-CM

## 2022-04-06 NOTE — Progress Notes (Signed)
Daily Group Progress Note   Program: CD IOP     Group Time: 9 a.m. to 12 p.m.    Type of Therapy: Process and Psychoeducational    Topic: The therapist checks in with group members, assesses for SI/HI/psychosis and overall level of functioning. The therapist inquires about sobriety date and number of community support meetings attended since last session.   The therapist explains what is meant by catastrophizing and has group members complete and discuss a Core Beliefs Worksheet.    Summary: Otto presents for group rating his depression as a "0" and anxiety as a "0" with no SI or HI.  Deklan describes his mood as "energetic" and "worried.' He notes that he is worried as his "head" is "not in this anymore." He says that he took Trintillex one time and opted to not take it anymore. He has not surprisingly attended no meetings. He is thinking that he could go back to smoking pot as long as he avoided pills. He notes that his main issue now is "boredom."   The therapist suggests that it is no surprised that his head is not in it based on the trajectory of things at last group. The therapist informs Konstantin that he does not need an anti-depressant based on his PHQ-9 and GAD-7 scores today. The therapist normalizes his feeling bored reminding him about PAWS. The therapist discloses his belief that if Guy continues on his present course that he will relapse and reiterates why smoking pot which lead to using other substances.   On his Core Beliefs exercise, he views himself as "funny + stubborn," other people as "annoying, full of shit," and the word as "lost."   Progress Towards Goals: Lundy denies any drug use.    UDS collected: No Results: No   AA/NA attended?: No  Sponsor?: No    Blu Phenix, [place], LCSW, [facility], LCAS 04/06/2022

## 2022-04-09 ENCOUNTER — Encounter (HOSPITAL_COMMUNITY): Payer: 59 | Admitting: Licensed Clinical Social Worker

## 2022-04-11 ENCOUNTER — Other Ambulatory Visit (HOSPITAL_COMMUNITY): Payer: 59 | Attending: Psychiatry | Admitting: Licensed Clinical Social Worker

## 2022-04-11 DIAGNOSIS — F1220 Cannabis dependence, uncomplicated: Secondary | ICD-10-CM | POA: Diagnosis not present

## 2022-04-11 DIAGNOSIS — F1021 Alcohol dependence, in remission: Secondary | ICD-10-CM | POA: Insufficient documentation

## 2022-04-11 DIAGNOSIS — F1420 Cocaine dependence, uncomplicated: Secondary | ICD-10-CM

## 2022-04-11 DIAGNOSIS — F1320 Sedative, hypnotic or anxiolytic dependence, uncomplicated: Secondary | ICD-10-CM

## 2022-04-11 DIAGNOSIS — F1590 Other stimulant use, unspecified, uncomplicated: Secondary | ICD-10-CM

## 2022-04-11 DIAGNOSIS — F1120 Opioid dependence, uncomplicated: Secondary | ICD-10-CM

## 2022-04-11 NOTE — Progress Notes (Addendum)
Daily Group Progress Note   Program: CD IOP     Group Time: 9 a.m. to 12 p.m.    Type of Therapy: Process and Psychoeducational    Topic: The therapist checks in with group members, assesses for SI/HI/psychosis and overall level of functioning. The therapist inquires about sobriety date and number of community support meetings attended since last session.   The therapist has group members complete the "Understanding Ambivalence To Change-Worksheet" and the "Building Discrepancy" Worksheet and discuss them with the group.    Summary: Zeven presents for group rating his depression as a "0" and anxiety as a "0" with no SI or HI.  Stone describes his mood as "very tired" and "worried." He says that he missed group on Monday as he was sick over the weekend. He is worried about his wife's surgery which is scheduled for November 1st.   He has not attended any meetings. He says that he is feeling better about his job after doing a quick job search to see what's out there noting that there is a "lot of crap."   In sharing his worksheets, he admits that he struggled with this exercise as the "only" reason that he is I the group is because of his wife. He says that he had good friendships when using but now has no friends.  He has difficulty seeing much if any negative consequences from using and blames his use of what he considers "hard" drugs on one particular woman while admitting that he has the belief that he can be "[place] sober" and hang out with his using friends and not end up back where he has several times.   The therapist asks Deaaron why he does not just stop attending the IOP and start hanging with his old friends and smoking pot and he responds that it would "look bad" to his wife and that he already "spent all this money" on treatment.   The therapist suggests that is may be a good idea to have a meeting with Johnta and his wife to get her perspective of how Sevyn was when using and what  the consequences were. He says that she would likely be agreeable and gets the therapist's direct number to call once he knows her schedule.   Progress Towards Goals: Jos denies any drug use.    UDS collected: Yes Results: No   AA/NA attended?: No  Sponsor?: No    Hershal Phenix, [place], LCSW, [facility], LCAS 04/11/2022

## 2022-04-13 ENCOUNTER — Other Ambulatory Visit (HOSPITAL_COMMUNITY): Payer: 59 | Attending: Psychiatry | Admitting: Licensed Clinical Social Worker

## 2022-04-13 DIAGNOSIS — F1920 Other psychoactive substance dependence, uncomplicated: Secondary | ICD-10-CM | POA: Insufficient documentation

## 2022-04-13 DIAGNOSIS — X58XXXS Exposure to other specified factors, sequela: Secondary | ICD-10-CM | POA: Diagnosis not present

## 2022-04-13 DIAGNOSIS — F1011 Alcohol abuse, in remission: Secondary | ICD-10-CM | POA: Insufficient documentation

## 2022-04-13 DIAGNOSIS — F1320 Sedative, hypnotic or anxiolytic dependence, uncomplicated: Secondary | ICD-10-CM | POA: Diagnosis not present

## 2022-04-13 DIAGNOSIS — Z6372 Alcoholism and drug addiction in family: Secondary | ICD-10-CM | POA: Insufficient documentation

## 2022-04-13 DIAGNOSIS — F4312 Post-traumatic stress disorder, chronic: Secondary | ICD-10-CM | POA: Insufficient documentation

## 2022-04-13 DIAGNOSIS — F1021 Alcohol dependence, in remission: Secondary | ICD-10-CM

## 2022-04-13 DIAGNOSIS — F1590 Other stimulant use, unspecified, uncomplicated: Secondary | ICD-10-CM | POA: Insufficient documentation

## 2022-04-13 DIAGNOSIS — F1120 Opioid dependence, uncomplicated: Secondary | ICD-10-CM | POA: Diagnosis not present

## 2022-04-13 DIAGNOSIS — F1220 Cannabis dependence, uncomplicated: Secondary | ICD-10-CM | POA: Diagnosis not present

## 2022-04-13 DIAGNOSIS — T7432XS Child psychological abuse, confirmed, sequela: Secondary | ICD-10-CM | POA: Insufficient documentation

## 2022-04-13 DIAGNOSIS — F1420 Cocaine dependence, uncomplicated: Secondary | ICD-10-CM | POA: Insufficient documentation

## 2022-04-13 NOTE — Progress Notes (Signed)
Daily Group Progress Note   Program: CD IOP     Group Time: 9 a.m. to 12 p.m.    Type of Therapy: Process and Psychoeducational    Topic: The therapist checks in with group members, assesses for SI/HI/psychosis and overall level of functioning. The therapist inquires about sobriety date and number of community support meetings attended since last session.   The therapist covers [employer] RP 15 on Managing Life Managing Money and RP 16 on Relapse Justification.    Summary: Isaiah Arnold presents for group rating his depression as a "0" and anxiety as a "0" with no SI or HI.  Isaiah Arnold describes his mood as "content" and "tired." He has attended no meetings and is quiet during most of the first half of the group but participates in the group exercises admitting that he took money from his kid's piggy banks in the past to buy drugs and his money from his wife to buy drugs.   He says that if he starts to engage in relapse justification that he remembers "HALT" and that the urge to use will pass. He says that going to the gym and exercising helps as well.  Isaiah Arnold says that he is better today than he was on Wednesday when he seemed to be considering smoking pot again.   Progress Towards Goals: Isaiah Arnold denies any drug use.    UDS collected: No Results: No   AA/NA attended?: No  Sponsor?: No    Isaiah Arnold, [place], LCSW, [facility], LCAS 04/13/2022

## 2022-04-16 ENCOUNTER — Other Ambulatory Visit (HOSPITAL_COMMUNITY): Payer: 59 | Attending: Medical | Admitting: Licensed Clinical Social Worker

## 2022-04-16 ENCOUNTER — Encounter (HOSPITAL_COMMUNITY): Payer: Self-pay | Admitting: Licensed Clinical Social Worker

## 2022-04-16 ENCOUNTER — Telehealth (HOSPITAL_COMMUNITY): Payer: Self-pay | Admitting: Licensed Clinical Social Worker

## 2022-04-16 DIAGNOSIS — Z6372 Alcoholism and drug addiction in family: Secondary | ICD-10-CM | POA: Insufficient documentation

## 2022-04-16 DIAGNOSIS — F1220 Cannabis dependence, uncomplicated: Secondary | ICD-10-CM | POA: Insufficient documentation

## 2022-04-16 DIAGNOSIS — F1011 Alcohol abuse, in remission: Secondary | ICD-10-CM | POA: Insufficient documentation

## 2022-04-16 DIAGNOSIS — F1320 Sedative, hypnotic or anxiolytic dependence, uncomplicated: Secondary | ICD-10-CM | POA: Diagnosis not present

## 2022-04-16 DIAGNOSIS — F1021 Alcohol dependence, in remission: Secondary | ICD-10-CM

## 2022-04-16 DIAGNOSIS — Z79899 Other long term (current) drug therapy: Secondary | ICD-10-CM | POA: Insufficient documentation

## 2022-04-16 DIAGNOSIS — F4312 Post-traumatic stress disorder, chronic: Secondary | ICD-10-CM | POA: Insufficient documentation

## 2022-04-16 DIAGNOSIS — T7432XS Child psychological abuse, confirmed, sequela: Secondary | ICD-10-CM | POA: Insufficient documentation

## 2022-04-16 DIAGNOSIS — F1920 Other psychoactive substance dependence, uncomplicated: Secondary | ICD-10-CM | POA: Diagnosis not present

## 2022-04-16 DIAGNOSIS — F1420 Cocaine dependence, uncomplicated: Secondary | ICD-10-CM | POA: Insufficient documentation

## 2022-04-16 DIAGNOSIS — F1120 Opioid dependence, uncomplicated: Secondary | ICD-10-CM | POA: Diagnosis not present

## 2022-04-16 DIAGNOSIS — Z818 Family history of other mental and behavioral disorders: Secondary | ICD-10-CM

## 2022-04-16 DIAGNOSIS — F1590 Other stimulant use, unspecified, uncomplicated: Secondary | ICD-10-CM

## 2022-04-16 DIAGNOSIS — F1510 Other stimulant abuse, uncomplicated: Secondary | ICD-10-CM | POA: Insufficient documentation

## 2022-04-16 DIAGNOSIS — Z811 Family history of alcohol abuse and dependence: Secondary | ICD-10-CM
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
## 2022-04-16 NOTE — Telephone Encounter (Signed)
The therapist attempts to reach Isaiah Arnold about his concern regarding the Genesight testing about speaking with the Rep today and leaves Isaiah Arnold a Financial risk analyst.  Jontez Phenix MA, LCSW, [place], [place]

## 2022-04-16 NOTE — Progress Notes (Signed)
Daily Group Progress Note   Program: CD IOP     Group Time: 9 a.m. to 12 p.m.    Type of Therapy: Process and Psychoeducational    Topic: The therapist checks in with group members, assesses for SI/HI/psychosis and overall level of functioning. The therapist inquires about sobriety date and number of community support meetings attended since last session.   The therapist shows videos on "10 Effects of Growing Up with an Alcoholic or Addict Parent" and "Why Do People with Addictions Choose Drugs/Alcohol Over Their Families" and facilitates a discussion concerning the material presented.    Summary: Isaiah Arnold presents for group rating his depression as a "0" and anxiety as a "0" with no SI or HI.  Reco describes his mood as "sleepy" and "frustrated" noting that his wife believes that he may have bipolar disorder.   [name] appears disinterested in group and after the break the therapist has to remind him to put away his cell phone as he is apparently playing a game.  The therapist talks to [name] at break about his apparent lack of desire to be in group and his unwillingness to connect with sober supports which would be a critical remaining goal if he were to stay in group.   [name] reiterates that he is only in group because his wife wants him to attend and also admits that if it were up to him that he would not attend at all. Thus, the therapist and [name] are in agreement that today will be his last group. He declines any aftercare nor does he plan on attending AA or NA.  Additionally, he complains that his Genesight testing was not covered and that it will cost him $5500 and that he would have not consented to this testing if he had known this. The therapist informs [name] that [name] has a guarantee that they would call a patient before doing the testing if it was determined that the person would pay more than $330. He provides [name] with the direct contact number for [place] and informs him that this  therapist can speak with the Rep from [place] as she will be in this office today.  When asked if he wants to share any takeaways from group, [name] says that he does not as he does not want to bring the group down anymore than he already has.    Progress Towards Goals: [name] denies any drug use.    UDS collected: No Results: No   AA/NA attended?: No  Sponsor?: No    [place]unn[place] Phenix, [place], LCSW, [facility], LCAS 04/16/2022

## 2022-04-18 ENCOUNTER — Encounter (HOSPITAL_COMMUNITY): Payer: 59

## 2022-04-20 ENCOUNTER — Encounter (HOSPITAL_COMMUNITY): Payer: 59

## 2022-04-23 ENCOUNTER — Encounter (HOSPITAL_COMMUNITY): Payer: 59
